# Patient Record
Sex: Female | Born: 1980 | Race: White | Hispanic: Yes | Marital: Single | Smoking: Never smoker
Health system: Southern US, Community
[De-identification: ages and names within clinical notes are randomized; demographics above are authoritative.]

## PROBLEM LIST (undated history)

## (undated) DIAGNOSIS — F419 Anxiety disorder, unspecified: Secondary | ICD-10-CM

## (undated) DIAGNOSIS — Z789 Other specified health status: Secondary | ICD-10-CM

## (undated) HISTORY — DX: Anxiety disorder, unspecified: F41.9

## (undated) HISTORY — PX: NO PAST SURGERIES: SHX2092

---

## 2006-01-23 ENCOUNTER — Ambulatory Visit: Payer: Self-pay | Admitting: Nurse Practitioner

## 2006-01-25 ENCOUNTER — Ambulatory Visit (HOSPITAL_COMMUNITY): Admission: RE | Admit: 2006-01-25 | Discharge: 2006-01-25 | Payer: Self-pay | Admitting: Family Medicine

## 2006-01-30 ENCOUNTER — Ambulatory Visit: Payer: Self-pay | Admitting: *Deleted

## 2006-06-01 ENCOUNTER — Ambulatory Visit: Payer: Self-pay | Admitting: Nurse Practitioner

## 2007-01-30 ENCOUNTER — Encounter (INDEPENDENT_AMBULATORY_CARE_PROVIDER_SITE_OTHER): Payer: Self-pay | Admitting: *Deleted

## 2007-05-17 ENCOUNTER — Ambulatory Visit: Payer: Self-pay | Admitting: Family Medicine

## 2007-05-21 ENCOUNTER — Ambulatory Visit: Payer: Self-pay | Admitting: Internal Medicine

## 2007-05-21 ENCOUNTER — Encounter (INDEPENDENT_AMBULATORY_CARE_PROVIDER_SITE_OTHER): Payer: Self-pay | Admitting: Nurse Practitioner

## 2007-05-22 ENCOUNTER — Ambulatory Visit: Payer: Self-pay | Admitting: Internal Medicine

## 2007-08-13 ENCOUNTER — Ambulatory Visit: Payer: Self-pay | Admitting: Internal Medicine

## 2007-09-22 IMAGING — CT CT ABDOMEN W/ CM
1 of 3 series · 14 of 32 positions shown, 19 images · IV contrast (30ML OMNI-MIX & [ID] OMNIP 300%)
Comparison: none

CLINICAL DATA: Epigastric pain through the back.    Weight loss and constipation.  Rule out gallstones or other abnormality.  
ABDOMEN CT WITH CONTRAST:
TECHNIQUE: Multidetector CT imaging of the abdomen was performed following the standard protocol during bolus administration of intravenous contrast.
Contrast:  100 cc Omnipaque 300.
TECHNIQUE: Multidetector CT imaging of the pelvis was performed following the standard protocol during bolus administration of intravenous contrast.

[Series 2: abd pelvis · axial · 0.65mm/px · z∈[-418,-63]mm · 14 of 81 slices shown, 19 images]
[im 5/81  soft-tissue]
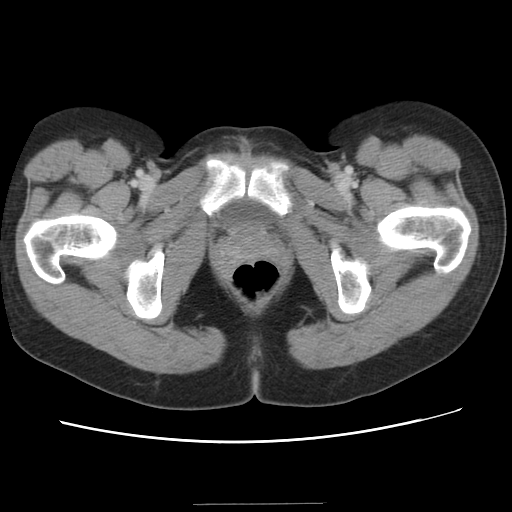
[im 5/81  bone]
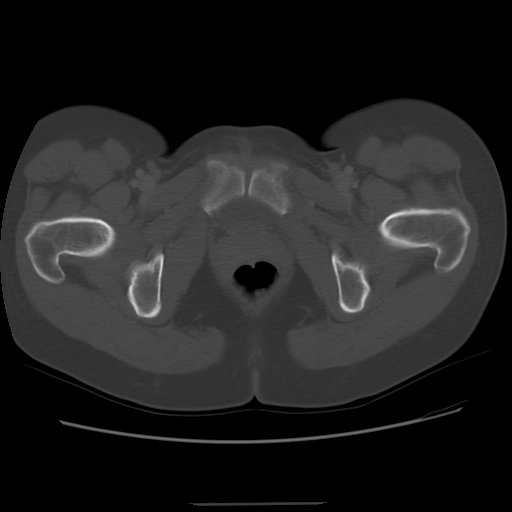
[im 10/81  soft-tissue]
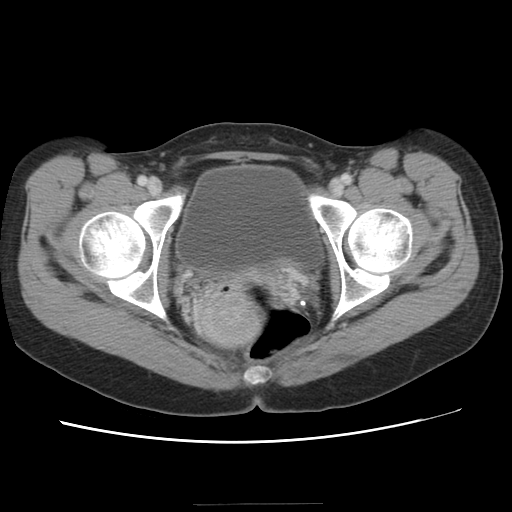
[im 19/81  soft-tissue]
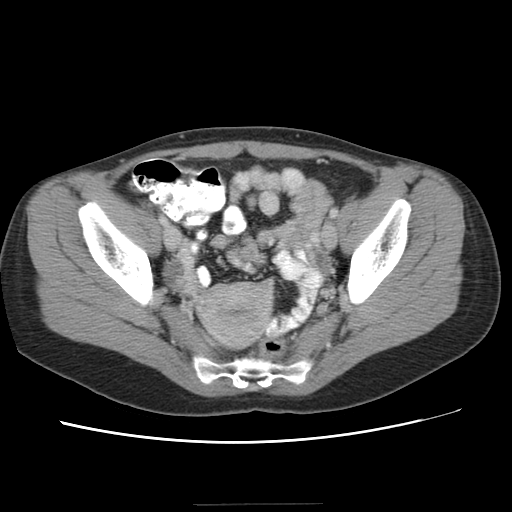
[im 24/81  soft-tissue]
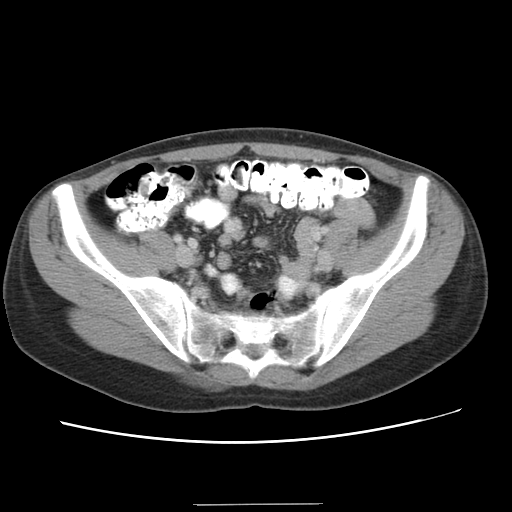
[im 29/81  soft-tissue]
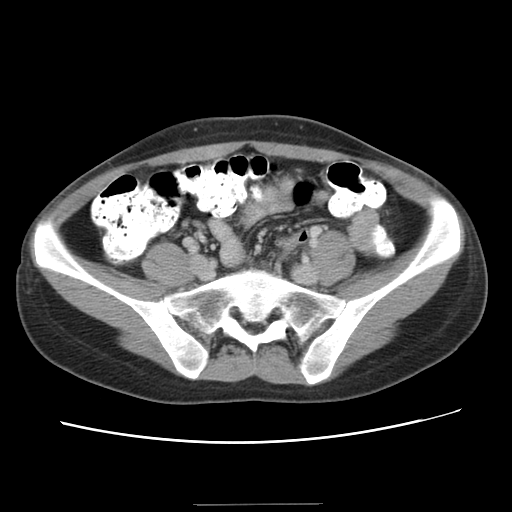
[im 33/81  soft-tissue]
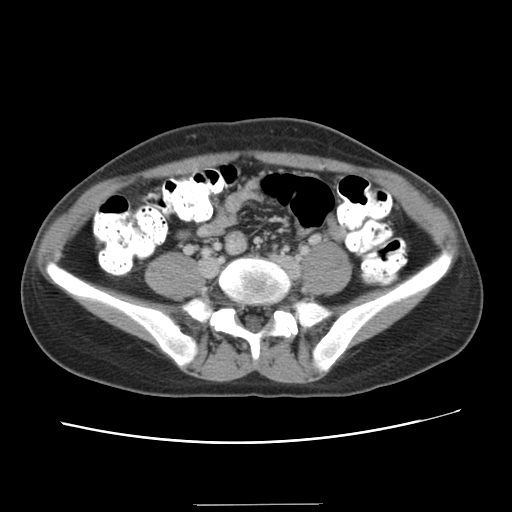
[im 43/81  soft-tissue]
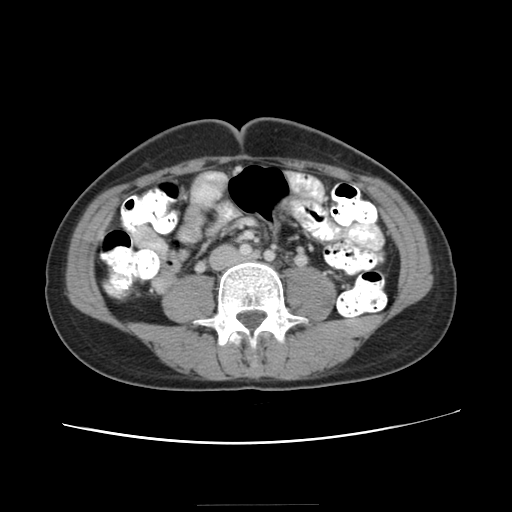
[im 48/81  soft-tissue]
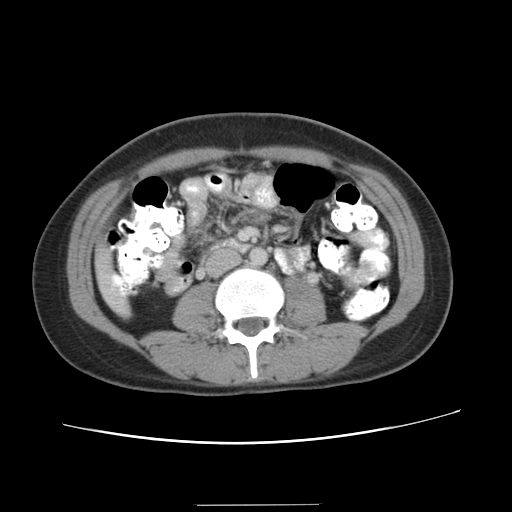
[im 52/81  soft-tissue]
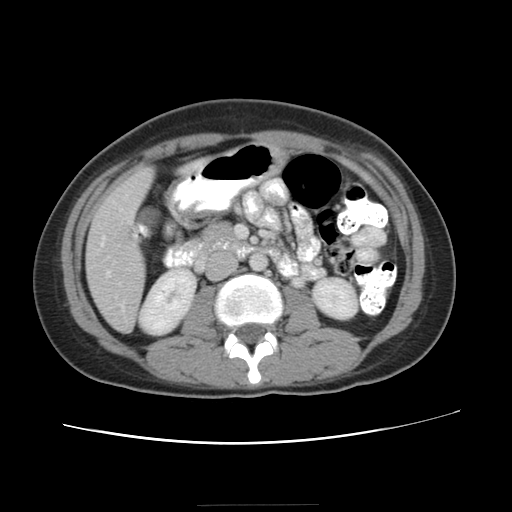
[im 52/81  bone]
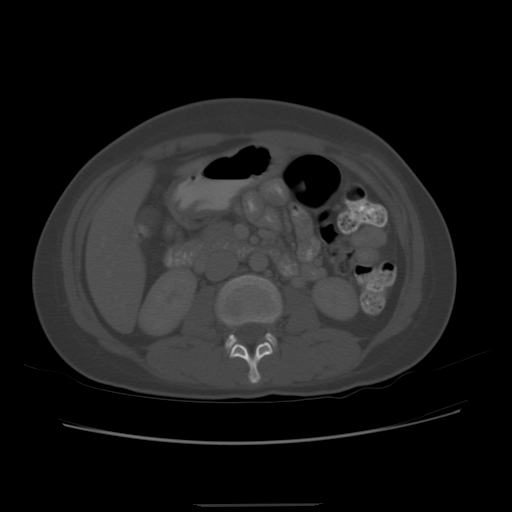
[im 57/81  soft-tissue]
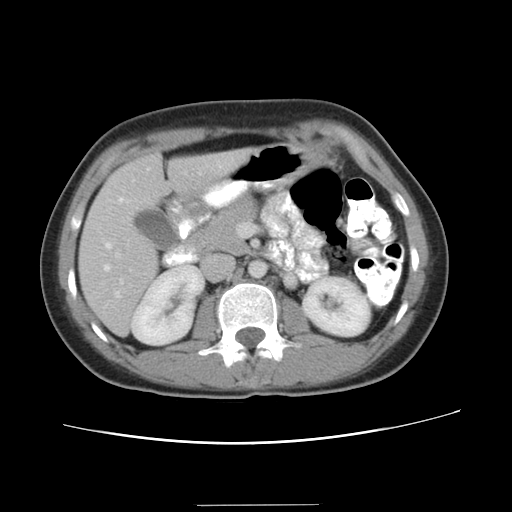
[im 62/81  soft-tissue]
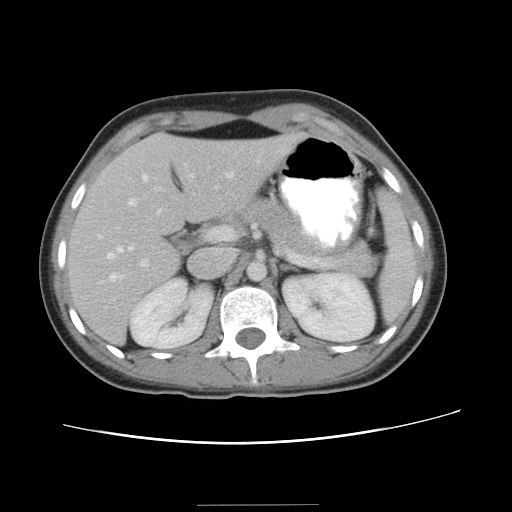
[im 62/81  lung]
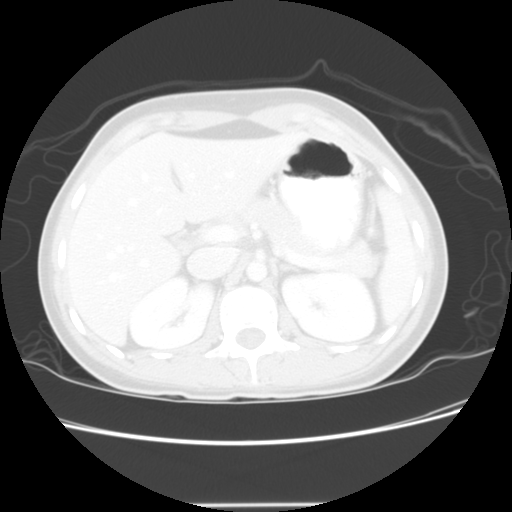
[im 66/81  lung]
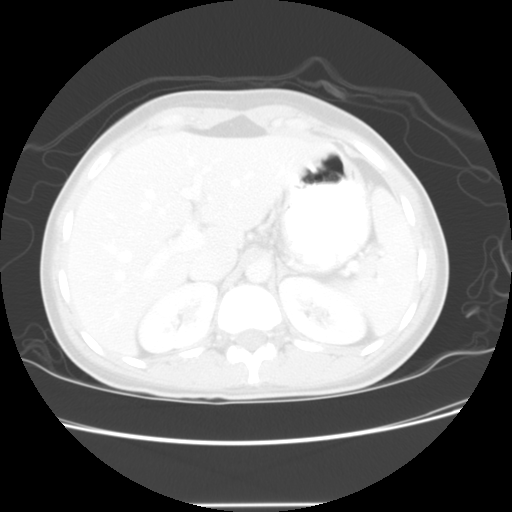
[im 71/81  soft-tissue]
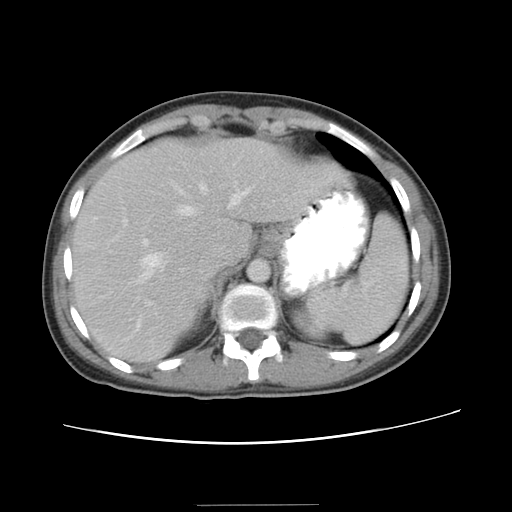
[im 71/81  lung]
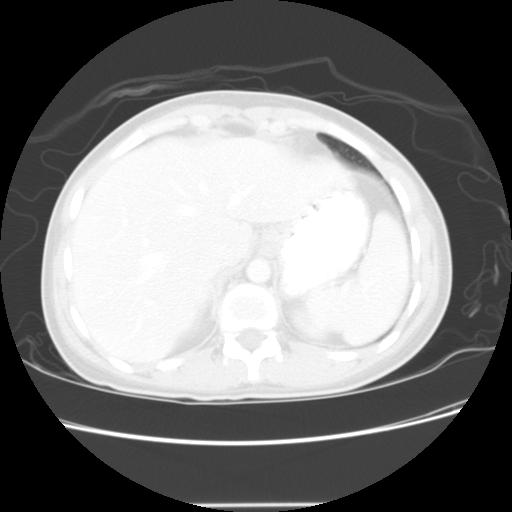
[im 76/81  soft-tissue]
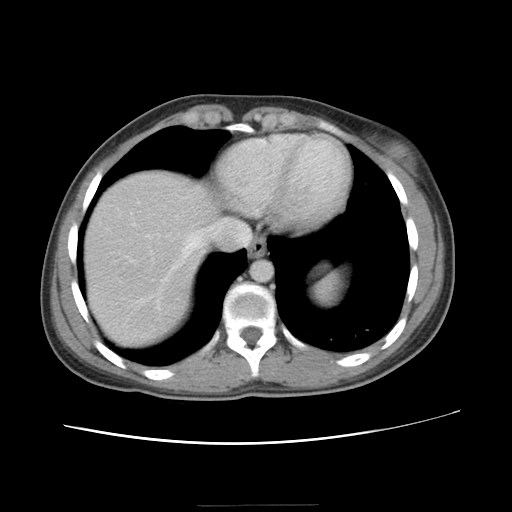
[im 76/81  lung]
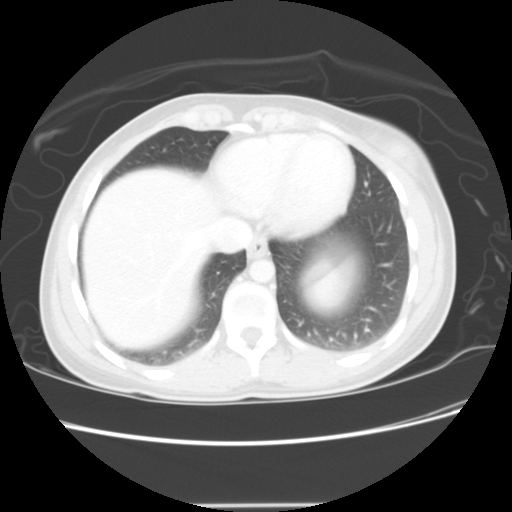

[14 of 32 positions shown; findings below may reference images not displayed]

FINDINGS: The lung bases are clear.   
Following the administration of intravenous contrast, the liver, gallbladder, spleen, kidneys, adrenals and pancreas all have a normal appearance.  Delayed images through the kidneys reveal symmetric bilateral excretion and a normal appearance of the proximal ureters.  Small bowel contrast was achieved and no focal bowel abnormalities are seen.  No intraabdominal adenopathy or fluid is noted.
IMPRESSION: Normal abdominal CT. 
PELVIS CT WITH CONTRAST:
FINDINGS: Distal colonic and small bowel contrast was achieved.  No focal bowel abnormalities are noted.  The appendix is filled.  No intraabdominal inflammatory change is seen and no abnormal fluid collections are noted.  The uterus and ovaries both have a normal appearance.  A small amount of fluid is seen in the cul-de-sac and given the patient?s age is likely physiologic in nature.  No abnormal adenopathy is noted.  Bony structures are intact.   Delayed images through the bladder reveal a normal bladder contour and normal distal ureters.
IMPRESSION: Normal pelvic CT.

## 2007-11-04 ENCOUNTER — Ambulatory Visit: Payer: Self-pay | Admitting: Internal Medicine

## 2008-01-27 ENCOUNTER — Ambulatory Visit: Payer: Self-pay | Admitting: Internal Medicine

## 2008-04-17 ENCOUNTER — Ambulatory Visit: Payer: Self-pay | Admitting: Internal Medicine

## 2008-07-09 ENCOUNTER — Ambulatory Visit: Payer: Self-pay | Admitting: Internal Medicine

## 2008-09-30 ENCOUNTER — Ambulatory Visit: Payer: Self-pay | Admitting: Internal Medicine

## 2008-10-27 ENCOUNTER — Ambulatory Visit: Payer: Self-pay | Admitting: Internal Medicine

## 2008-12-22 ENCOUNTER — Ambulatory Visit: Payer: Self-pay | Admitting: Internal Medicine

## 2009-03-15 ENCOUNTER — Ambulatory Visit: Payer: Self-pay | Admitting: Internal Medicine

## 2009-06-08 ENCOUNTER — Ambulatory Visit: Payer: Self-pay | Admitting: Internal Medicine

## 2010-06-05 ENCOUNTER — Encounter: Payer: Self-pay | Admitting: Family Medicine

## 2011-01-06 LAB — GC/CHLAMYDIA PROBE AMP, GENITAL
Chlamydia: NEGATIVE
Gonorrhea: NEGATIVE

## 2011-02-02 LAB — ANTIBODY SCREEN: Antibody Screen: NEGATIVE

## 2011-03-27 LAB — RPR: RPR: NONREACTIVE

## 2011-05-16 NOTE — L&D Delivery Note (Signed)
Delivery Note At 9:47 PM a viable female was delivered via Vaginal, Spontaneous Delivery (Presentation: ; Occiput Anterior).  APGAR: 8, 9; weight 7 lb 3.9 oz (3285 g).   Placenta status: Intact, Spontaneous.  Cord: 3 vessels with the following complications: None.    Anesthesia: Epidural  Episiotomy: None Lacerations: 1st degree;Perineal Suture Repair: 3.0 vicryl rapide Est. Blood Loss (mL): 200 ml  Mom to postpartum.  Baby to nursery-stable.  JACKSON-MOORE,Josehua Hammar A 06/15/2011, 10:08 PM

## 2011-06-15 ENCOUNTER — Inpatient Hospital Stay (HOSPITAL_COMMUNITY): Payer: Medicaid Other | Admitting: Anesthesiology

## 2011-06-15 ENCOUNTER — Encounter (HOSPITAL_COMMUNITY): Payer: Self-pay | Admitting: Anesthesiology

## 2011-06-15 ENCOUNTER — Encounter (HOSPITAL_COMMUNITY): Payer: Self-pay | Admitting: *Deleted

## 2011-06-15 ENCOUNTER — Inpatient Hospital Stay (HOSPITAL_COMMUNITY)
Admission: AD | Admit: 2011-06-15 | Discharge: 2011-06-17 | DRG: 775 | Disposition: A | Discharge status: Home / Self Care | Payer: Medicaid Other | Source: Ambulatory Visit | Attending: Obstetrics & Gynecology | Admitting: Obstetrics & Gynecology

## 2011-06-15 DIAGNOSIS — IMO0001 Reserved for inherently not codable concepts without codable children: Secondary | ICD-10-CM

## 2011-06-15 HISTORY — DX: Other specified health status: Z78.9

## 2011-06-15 LAB — CBC
HCT: 36.7 % (ref 36.0–46.0)
Hemoglobin: 12.3 g/dL (ref 12.0–15.0)
MCH: 30.9 pg (ref 26.0–34.0)
MCV: 92.2 fL (ref 78.0–100.0)
RBC: 3.98 MIL/uL (ref 3.87–5.11)

## 2011-06-15 LAB — ABO/RH: RH Type: POSITIVE

## 2011-06-15 LAB — RPR: RPR: NONREACTIVE

## 2011-06-15 MED ORDER — LIDOCAINE HCL 1.5 % IJ SOLN
INTRAMUSCULAR | Status: DC | PRN
  Administered 2011-06-15 (×2): 5 mL via EPIDURAL

## 2011-06-15 MED ORDER — FLEET ENEMA 7-19 GM/118ML RE ENEM: 1.0000 | ENEMA | RECTAL | Status: DC | PRN

## 2011-06-15 MED ORDER — IBUPROFEN 600 MG PO TABS: 600.0000 mg | ORAL_TABLET | Freq: Four times a day (QID) | ORAL | Status: DC | PRN

## 2011-06-15 MED ORDER — LACTATED RINGERS IV SOLN: 500.0000 mL | Freq: Once | INTRAVENOUS | Status: DC

## 2011-06-15 MED ORDER — EPHEDRINE 5 MG/ML INJ: 10.0000 mg | INTRAVENOUS | Status: DC | PRN

## 2011-06-15 MED ORDER — PHENYLEPHRINE 40 MCG/ML (10ML) SYRINGE FOR IV PUSH (FOR BLOOD PRESSURE SUPPORT): 80.0000 ug | PREFILLED_SYRINGE | INTRAVENOUS | Status: DC | PRN

## 2011-06-15 MED ORDER — OXYTOCIN 20 UNITS IN LACTATED RINGERS INFUSION - SIMPLE
125.0000 mL/h | Freq: Once | INTRAVENOUS | Status: AC
  Administered 2011-06-15: 125 mL/h via INTRAVENOUS

## 2011-06-15 MED ORDER — ACETAMINOPHEN 325 MG PO TABS: 650.0000 mg | ORAL_TABLET | ORAL | Status: DC | PRN

## 2011-06-15 MED ORDER — OXYCODONE-ACETAMINOPHEN 5-325 MG PO TABS
1.0000 | ORAL_TABLET | ORAL | Status: DC | PRN
  Administered 2011-06-15: 1 via ORAL
  Filled 2011-06-15: qty 1

## 2011-06-15 MED ORDER — OXYTOCIN BOLUS FROM INFUSION
500.0000 mL | Freq: Once | INTRAVENOUS | Status: DC
  Filled 2011-06-15: qty 1000
  Filled 2011-06-15: qty 500

## 2011-06-15 MED ORDER — ONDANSETRON HCL 4 MG/2ML IJ SOLN: 4.0000 mg | Freq: Four times a day (QID) | INTRAMUSCULAR | Status: DC | PRN

## 2011-06-15 MED ORDER — EPHEDRINE 5 MG/ML INJ
10.0000 mg | INTRAVENOUS | Status: DC | PRN
  Filled 2011-06-15: qty 4

## 2011-06-15 MED ORDER — LACTATED RINGERS IV SOLN: 500.0000 mL | INTRAVENOUS | Status: DC | PRN

## 2011-06-15 MED ORDER — LACTATED RINGERS IV SOLN
INTRAVENOUS | Status: DC
  Administered 2011-06-15 (×2): via INTRAVENOUS

## 2011-06-15 MED ORDER — BUTORPHANOL TARTRATE 2 MG/ML IJ SOLN
1.0000 mg | INTRAMUSCULAR | Status: DC | PRN
  Administered 2011-06-15: 1 mg via INTRAVENOUS
  Filled 2011-06-15: qty 1

## 2011-06-15 MED ORDER — FENTANYL 2.5 MCG/ML BUPIVACAINE 1/10 % EPIDURAL INFUSION (WH - ANES)
14.0000 mL/h | INTRAMUSCULAR | Status: DC
  Administered 2011-06-15: 12 mL/h via EPIDURAL
  Filled 2011-06-15: qty 60

## 2011-06-15 MED ORDER — DIPHENHYDRAMINE HCL 50 MG/ML IJ SOLN: 12.5000 mg | INTRAMUSCULAR | Status: DC | PRN

## 2011-06-15 MED ORDER — CITRIC ACID-SODIUM CITRATE 334-500 MG/5ML PO SOLN: 30.0000 mL | ORAL | Status: DC | PRN

## 2011-06-15 MED ORDER — LIDOCAINE HCL (PF) 1 % IJ SOLN
30.0000 mL | INTRAMUSCULAR | Status: DC | PRN
  Administered 2011-06-15: 30 mL via SUBCUTANEOUS
  Filled 2011-06-15: qty 30

## 2011-06-15 NOTE — Progress Notes (Signed)
[facility] Department of Clinical Social Work Documentation of Interpretation   I assisted __Lori  RN_________________ with interpretation of ___questions___________________ for this patient. 

## 2011-06-15 NOTE — Progress Notes (Signed)
Zeva Aguirre-Cortez is a 31 y.o. G3P2002 at [redacted]w[redacted]d by LMP admitted for active labor  Subjective: C/O UCs  Objective: BP 129/92  Pulse 93  Temp(Src) 98.3 F (36.8 C) (Oral)  Resp 18  Ht 5\' 4"  (1.626 m)  Wt 66.679 kg (147 lb)  BMI 25.23 kg/m2  SpO2 98%  LMP 09/18/2010      FHT:  FHR: 140 bpm, variability: moderate,  accelerations:  Present,  decelerations:  Absent UC:   regular, every 2 minutes SVE:   Dilation: Lip/rim Effacement (%): 100 Station: 0 Exam by:: a. harper, rnc ob  Labs: Lab Results  Component Value Date   WBC 11.4* 06/15/2011   HGB 12.3 06/15/2011   HCT 36.7 06/15/2011   MCV 92.2 06/15/2011   PLT 269 06/15/2011    Assessment / Plan: Spontaneous labor, progressing normally  Labor: Progressing normally Preeclampsia:  n/a Fetal Wellbeing:  Category I Pain Control:  Epidural I/D:  n/a Anticipated MOD:  NSVD  JACKSON-MOORE,Raeana Blinn A 06/15/2011, 8:52 PM

## 2011-06-15 NOTE — Anesthesia Procedure Notes (Signed)
Epidural Patient location during procedure: OB Start time: 06/15/2011 5:56 PM  Staffing Anesthesiologist: Brayton Caves R Performed by: anesthesiologist   Preanesthetic Checklist Completed: patient identified, site marked, surgical consent, pre-op evaluation, timeout performed, IV checked, risks and benefits discussed and monitors and equipment checked  Epidural Patient position: sitting Prep: site prepped and draped and DuraPrep Patient monitoring: continuous pulse ox and blood pressure Approach: midline Injection technique: LOR air and LOR saline  Needle:  Needle type: Tuohy  Needle gauge: 17 G Needle length: 9 cm Needle insertion depth: 5 cm cm Catheter type: closed end flexible Catheter size: 19 Gauge Catheter at skin depth: 10 cm Test dose: negative  Assessment Events: blood not aspirated, injection not painful, no injection resistance, negative IV test and no paresthesia  Additional Notes Patient identified.  Risk benefits discussed including failed block, incomplete pain control, headache, nerve damage, paralysis, blood pressure changes, nausea, vomiting, reactions to medication both toxic or allergic, and postpartum back pain.  Patient expressed understanding and wished to proceed.  All questions were answered.  Sterile technique used throughout procedure and epidural site dressed with sterile barrier dressing. No paresthesia or other complications noted.The patient did not experience any signs of intravascular injection such as tinnitus or metallic taste in mouth nor signs of intrathecal spread such as rapid motor block. Please see nursing notes for vital signs.

## 2011-06-15 NOTE — Progress Notes (Signed)
U/C's started today at 0800 and coming closer together.  Now U/c's are every 4 min. Per pt.  No vaginal bleeding.

## 2011-06-15 NOTE — Anesthesia Preprocedure Evaluation (Signed)

## 2011-06-15 NOTE — H&P (Signed)
Donna Trevino is a 31 y.o. female presenting for contractions. Maternal Medical History:  Reason for admission: Reason for admission: rupture of membranes and contractions.  Contractions: Frequency: regular.    Prenatal complications: no prenatal complications   OB History    Grav Para Term Preterm Abortions TAB SAB Ect Mult Living   3 2 2       2      Past Medical History  Diagnosis Date  . No pertinent past medical history    Past Surgical History  Procedure Date  . No past surgeries    Family History: family history is negative for Anesthesia problems. Social History:  reports that she has never smoked. She has never used smokeless tobacco. She reports that she does not drink alcohol or use illicit drugs.  Review of Systems  Constitutional: Negative for fever.  Eyes: Negative for blurred vision.  Respiratory: Negative for shortness of breath.   Gastrointestinal: Negative for vomiting.  Skin: Negative for rash.  Neurological: Negative for headaches.    Dilation: 4.5 Effacement (%): 80 Station: -2 Exam by:: D. Coley, RN Blood pressure 124/78, pulse 78, temperature 98.1 F (36.7 C), temperature source Oral, resp. rate 16, height 5\' 4"  (1.626 m), weight 66.679 kg (147 lb), last menstrual period 09/18/2010. Maternal Exam:  Uterine Assessment: Contraction frequency is regular.   Abdomen: Fetal presentation: vertex  Introitus: Ferning test: positive.  Amniotic fluid character: clear.  Cervix: Cervix evaluated by digital exam.     Fetal Exam Fetal Monitor Review: Variability: moderate (6-25 bpm).   Pattern: accelerations present and no decelerations.    Fetal State Assessment: Category I - tracings are normal.     Physical Exam  Constitutional: She appears well-developed.  HENT:  Head: Normocephalic.  Neck: Neck supple. No thyromegaly present.  Cardiovascular: Normal rate and regular rhythm.   Respiratory: Breath sounds normal.  GI: Soft. Bowel  sounds are normal.  Skin: No rash noted.    Prenatal labs: ABO, Rh:   Antibody:   Rubella:   RPR:    HBsAg:    HIV:    GBS: Negative (01/10 0000)   Assessment/Plan: Multipara at term, active labor, Category 1 FHT Admit, anticipate an NSVD   JACKSON-MOORE,Sahiti Joswick A 06/15/2011, 4:45 PM

## 2011-06-16 LAB — CBC
Hemoglobin: 10.5 g/dL — ABNORMAL LOW (ref 12.0–15.0)
MCH: 30.2 pg (ref 26.0–34.0)
MCHC: 32.7 g/dL (ref 30.0–36.0)
MCV: 92.2 fL (ref 78.0–100.0)
RBC: 3.48 MIL/uL — ABNORMAL LOW (ref 3.87–5.11)

## 2011-06-16 MED ORDER — PRENATAL MULTIVITAMIN CH
1.0000 | ORAL_TABLET | Freq: Every day | ORAL | Status: DC
  Administered 2011-06-16 – 2011-06-17 (×2): 1 via ORAL
  Filled 2011-06-16 (×2): qty 1

## 2011-06-16 MED ORDER — ZOLPIDEM TARTRATE 5 MG PO TABS: 5.0000 mg | ORAL_TABLET | Freq: Every evening | ORAL | Status: DC | PRN

## 2011-06-16 MED ORDER — LANOLIN HYDROUS EX OINT: TOPICAL_OINTMENT | CUTANEOUS | Status: DC | PRN

## 2011-06-16 MED ORDER — FERROUS SULFATE 325 (65 FE) MG PO TABS
325.0000 mg | ORAL_TABLET | Freq: Two times a day (BID) | ORAL | Status: DC
  Administered 2011-06-16 – 2011-06-17 (×3): 325 mg via ORAL
  Filled 2011-06-16 (×3): qty 1

## 2011-06-16 MED ORDER — IBUPROFEN 600 MG PO TABS
600.0000 mg | ORAL_TABLET | Freq: Four times a day (QID) | ORAL | Status: DC
  Administered 2011-06-16 – 2011-06-17 (×7): 600 mg via ORAL
  Filled 2011-06-16 (×7): qty 1

## 2011-06-16 MED ORDER — ONDANSETRON HCL 4 MG PO TABS: 4.0000 mg | ORAL_TABLET | ORAL | Status: DC | PRN

## 2011-06-16 MED ORDER — SENNOSIDES-DOCUSATE SODIUM 8.6-50 MG PO TABS
2.0000 | ORAL_TABLET | Freq: Every day | ORAL | Status: DC
  Administered 2011-06-16: 2 via ORAL

## 2011-06-16 MED ORDER — DIPHENHYDRAMINE HCL 25 MG PO CAPS: 25.0000 mg | ORAL_CAPSULE | Freq: Four times a day (QID) | ORAL | Status: DC | PRN

## 2011-06-16 MED ORDER — MAGNESIUM HYDROXIDE 400 MG/5ML PO SUSP: 30.0000 mL | ORAL | Status: DC | PRN

## 2011-06-16 MED ORDER — MEDROXYPROGESTERONE ACETATE 150 MG/ML IM SUSP: 150.0000 mg | INTRAMUSCULAR | Status: DC | PRN

## 2011-06-16 MED ORDER — MEASLES, MUMPS & RUBELLA VAC ~~LOC~~ INJ
0.5000 mL | INJECTION | Freq: Once | SUBCUTANEOUS | Status: DC
  Filled 2011-06-16: qty 0.5

## 2011-06-16 MED ORDER — INFLUENZA VIRUS VACC SPLIT PF IM SUSP
0.5000 mL | INTRAMUSCULAR | Status: AC | PRN
  Administered 2011-06-16: 0.5 mL via INTRAMUSCULAR
  Filled 2011-06-16: qty 0.5

## 2011-06-16 MED ORDER — WITCH HAZEL-GLYCERIN EX PADS: 1.0000 "application " | MEDICATED_PAD | CUTANEOUS | Status: DC | PRN

## 2011-06-16 MED ORDER — TETANUS-DIPHTH-ACELL PERTUSSIS 5-2.5-18.5 LF-MCG/0.5 IM SUSP: 0.5000 mL | Freq: Once | INTRAMUSCULAR | Status: DC

## 2011-06-16 MED ORDER — ONDANSETRON HCL 4 MG/2ML IJ SOLN: 4.0000 mg | INTRAMUSCULAR | Status: DC | PRN

## 2011-06-16 MED ORDER — OXYCODONE-ACETAMINOPHEN 5-325 MG PO TABS
1.0000 | ORAL_TABLET | ORAL | Status: DC | PRN
  Administered 2011-06-16: 1 via ORAL
  Filled 2011-06-16: qty 1

## 2011-06-16 MED ORDER — DIBUCAINE 1 % RE OINT: 1.0000 "application " | TOPICAL_OINTMENT | RECTAL | Status: DC | PRN

## 2011-06-16 MED ORDER — BENZOCAINE-MENTHOL 20-0.5 % EX AERO: 1.0000 "application " | INHALATION_SPRAY | CUTANEOUS | Status: DC | PRN

## 2011-06-16 NOTE — Anesthesia Postprocedure Evaluation (Signed)
  Anesthesia Post-op Note  Patient: International aid/development worker  Procedure(s) Performed: * No procedures listed *  Patient Location: PACU and Mother/Baby  Anesthesia Type: Epidural  Level of Consciousness: awake  Airway and Oxygen Therapy: Patient Spontanous Breathing  Post-op Pain: none  Post-op Assessment: Patient's Cardiovascular Status Stable, Respiratory Function Stable and Pain level controlled  Post-op Vital Signs: Reviewed and stable  Complications: No apparent anesthesia complications

## 2011-06-16 NOTE — Progress Notes (Signed)
Post Partum Day 1 Subjective: no complaints, up ad lib, voiding, tolerating PO and breastfeeding without difficulty. Denies HA, RUQ pain or blurred vision.   Objective: Blood pressure 95/56, pulse 78, temperature 98 F (36.7 C), temperature source Oral, resp. rate 18, height 5\' 4"  (1.626 m), weight 66.679 kg (147 lb), last menstrual period 09/18/2010, SpO2 98.00%, unknown if currently breastfeeding.  Physical Exam:  General: alert, cooperative, appears stated age and no distress Lochia: appropriate Uterine Fundus: firm Incision: healing well, no significant drainage, no dehiscence, no significant erythema DVT Evaluation: No evidence of DVT seen on physical exam. No cords or calf tenderness. No significant calf/ankle edema.   Basename 06/16/11 0540 06/15/11 1700  HGB 10.5* 12.3  HCT 32.1* 36.7    Assessment/Plan: Plan for discharge tomorrow and Breastfeeding   LOS: 1 day   HAMBY, Maleiyah Releford 06/16/2011, 8:06 AM

## 2011-06-16 NOTE — Progress Notes (Signed)
UR Chart review completed.  

## 2011-06-17 NOTE — Progress Notes (Signed)
Patient ID: Donna Trevino, female   DOB: April 23, 1981, 31 y.o.   MRN: [id number] Postpartum day one Vital signs normal Fundus firm Lochia moderate Legs negative No complaints

## 2011-06-18 NOTE — Discharge Summary (Signed)
Obstetric Discharge Summary Reason for Admission: onset of labor Prenatal Procedures: none Intrapartum Procedures: spontaneous vaginal delivery Postpartum Procedures: none Complications-Operative and Postpartum: none Hemoglobin  Date Value Range Status  06/16/2011 10.5* 12.0-15.0 (g/dL) Final     HCT  Date Value Range Status  06/16/2011 32.1* 36.0-46.0 (%) Final    Discharge Diagnoses: Term Pregnancy-delivered  Discharge Information: Date: 06/18/2011 Activity: pelvic rest Diet: routine Medications: Percocet Condition: stable Instructions: refer to practice specific booklet Discharge to: home Follow-up Information    Follow up with HARPER,CHARLES A, MD. Call in 6 weeks.   Contact information:   [address] [place] [address] [phone]          Newborn Data: Live born female  Birth Weight: 7 lb 3.9 oz (3285 g) APGAR: 8, 9  Home with mother.  Wealthy Danielski A 06/18/2011, 6:54 AM

## 2014-03-16 ENCOUNTER — Encounter (HOSPITAL_COMMUNITY): Payer: Self-pay | Admitting: *Deleted

## 2020-10-01 ENCOUNTER — Telehealth: Payer: Self-pay | Admitting: *Deleted

## 2020-10-01 NOTE — Telephone Encounter (Signed)
Notes on file from [facility], [place], [place], [phone]. Referral sent to scheduling.

## 2020-10-17 ENCOUNTER — Emergency Department (HOSPITAL_COMMUNITY)
Admission: EM | Admit: 2020-10-17 | Discharge: 2020-10-17 | Disposition: A | Discharge status: Home / Self Care | Payer: Self-pay | Attending: Emergency Medicine | Admitting: Emergency Medicine

## 2020-10-17 ENCOUNTER — Other Ambulatory Visit: Payer: Self-pay

## 2020-10-17 ENCOUNTER — Encounter (HOSPITAL_COMMUNITY): Payer: Self-pay

## 2020-10-17 DIAGNOSIS — K529 Noninfective gastroenteritis and colitis, unspecified: Secondary | ICD-10-CM

## 2020-10-17 DIAGNOSIS — R197 Diarrhea, unspecified: Secondary | ICD-10-CM | POA: Insufficient documentation

## 2020-10-17 DIAGNOSIS — R1013 Epigastric pain: Secondary | ICD-10-CM | POA: Insufficient documentation

## 2020-10-17 DIAGNOSIS — R112 Nausea with vomiting, unspecified: Secondary | ICD-10-CM | POA: Insufficient documentation

## 2020-10-17 LAB — COMPREHENSIVE METABOLIC PANEL
ALT: 22 U/L (ref 0–44)
AST: 27 U/L (ref 15–41)
Albumin: 4.4 g/dL (ref 3.5–5.0)
Alkaline Phosphatase: 58 U/L (ref 38–126)
Anion gap: 9 (ref 5–15)
BUN: 9 mg/dL (ref 6–20)
CO2: 25 mmol/L (ref 22–32)
Calcium: 9.2 mg/dL (ref 8.9–10.3)
Chloride: 107 mmol/L (ref 98–111)
Creatinine, Ser: 0.68 mg/dL (ref 0.44–1.00)
GFR, Estimated: >60 mL/min (ref >60)
Glucose, Bld: 113 mg/dL — ABNORMAL HIGH (ref 70–99)
Potassium: 3.8 mmol/L (ref 3.5–5.1)
Sodium: 141 mmol/L (ref 135–145)
Total Bilirubin: 0.8 mg/dL (ref 0.3–1.2)
Total Protein: 8 g/dL (ref 6.5–8.1)

## 2020-10-17 LAB — CBC
HCT: 44.7 % (ref 36.0–46.0)
Hemoglobin: 14.6 g/dL (ref 12.0–15.0)
MCH: 31.7 pg (ref 26.0–34.0)
MCHC: 32.7 g/dL (ref 30.0–36.0)
MCV: 97 fL (ref 80.0–100.0)
Platelets: 292 10*3/uL (ref 150–400)
RBC: 4.61 MIL/uL (ref 3.87–5.11)
RDW: 11.8 % (ref 11.5–15.5)
WBC: 10 10*3/uL (ref 4.0–10.5)
nRBC: 0 % (ref 0.0–0.2)

## 2020-10-17 LAB — URINALYSIS, ROUTINE W REFLEX MICROSCOPIC
Bacteria, UA: NONE SEEN
Bilirubin Urine: NEGATIVE
Glucose, UA: NEGATIVE mg/dL
Ketones, ur: NEGATIVE mg/dL
Leukocytes,Ua: NEGATIVE
Nitrite: NEGATIVE
Protein, ur: 30 mg/dL — AB
Specific Gravity, Urine: 1.024 (ref 1.005–1.030)
pH: 5 (ref 5.0–8.0)

## 2020-10-17 LAB — I-STAT BETA HCG BLOOD, ED (MC, WL, AP ONLY): I-stat hCG, quantitative: <5 m[IU]/mL (ref <5)

## 2020-10-17 LAB — LIPASE, BLOOD: Lipase: 39 U/L (ref 11–51)

## 2020-10-17 MED ORDER — DICYCLOMINE HCL 10 MG PO CAPS
10.0000 mg | ORAL_CAPSULE | Freq: Once | ORAL | Status: AC
  Administered 2020-10-17: 10 mg via ORAL
  Filled 2020-10-17: qty 1

## 2020-10-17 MED ORDER — ONDANSETRON 4 MG PO TBDP: 4.0000 mg | ORAL_TABLET | Freq: Three times a day (TID) | ORAL | 0 refills | Status: DC | PRN

## 2020-10-17 MED ORDER — ONDANSETRON 4 MG PO TBDP
4.0000 mg | ORAL_TABLET | Freq: Once | ORAL | Status: AC
  Administered 2020-10-17: 4 mg via ORAL
  Filled 2020-10-17: qty 1

## 2020-10-17 MED ORDER — ALUM & MAG HYDROXIDE-SIMETH 200-200-20 MG/5ML PO SUSP
30.0000 mL | Freq: Once | ORAL | Status: AC
  Administered 2020-10-17: 30 mL via ORAL
  Filled 2020-10-17: qty 30

## 2020-10-17 NOTE — ED Provider Notes (Signed)
[facility] EMERGENCY DEPARTMENT Provider Note   CSN: [id number] Arrival date & time: 10/17/20  1554     History No chief complaint on file.   Donna Trevino is a 40 y.o. female.  The history is provided by the patient. A language interpreter was used.   Donna Trevino is a 40 y.o. female who presents to the Emergency Department complaining of N/V/D abdominal pain. Pain started 2 AM. She did have upset stomach about two days prior. Her pain is located in the epigastric region. Shortly after her pain started she developed significant nausea, vomiting, diarrhea. No hematemesis, hematochezia or melena. She has no shortness of breath or fevers or dysuria. She believes this may be related to eating pork the evening prior to her symptoms developing.   No tobacco.  No alcohol.  No street drugs.  No prior surgeries.      Past Medical History:  Diagnosis Date  . No pertinent past medical history     Patient Active Problem List   Diagnosis Date Noted  . Normal delivery 06/15/2011    Past Surgical History:  Procedure Laterality Date  . NO PAST SURGERIES       OB History    Gravida  3   Para  3   Term  3   Preterm      AB      Living  3     SAB      IAB      Ectopic      Multiple      Live Births  1           Family History  Problem Relation Age of Onset  . Anesthesia problems Neg Hx     Social History   Tobacco Use  . Smoking status: Never Smoker  . Smokeless tobacco: Never Used  Substance Use Topics  . Alcohol use: No  . Drug use: No    Home Medications Prior to Admission medications   Not on File    Allergies    Patient has no known allergies.  Review of Systems   Review of Systems  All other systems reviewed and are negative.   Physical Exam Updated Vital Signs BP 138/78 (BP Location: Left Arm)   Pulse 78   Temp 98.1 F (36.7 C) (Oral)   Resp 16   SpO2 (!) 79%   Physical Exam Vitals and  nursing note reviewed.  Constitutional:      Appearance: She is well-developed.  HENT:     Head: Normocephalic and atraumatic.  Cardiovascular:     Rate and Rhythm: Normal rate and regular rhythm.     Heart sounds: No murmur heard.   Pulmonary:     Effort: Pulmonary effort is normal. No respiratory distress.     Breath sounds: Normal breath sounds.  Abdominal:     Palpations: Abdomen is soft.     Tenderness: There is no guarding or rebound.     Comments: Mild epigastric tenderness  Musculoskeletal:        General: No tenderness.  Skin:    General: Skin is warm and dry.  Neurological:     Mental Status: She is alert and oriented to person, place, and time.  Psychiatric:        Behavior: Behavior normal.     ED Results / Procedures / Treatments   Labs (all labs ordered are listed, but only abnormal results are displayed) Labs Reviewed  COMPREHENSIVE METABOLIC PANEL -  Abnormal; Notable for the following components:      Result Value   Glucose, Bld 113 (*)    All other components within normal limits  URINALYSIS, ROUTINE W REFLEX MICROSCOPIC - Abnormal; Notable for the following components:   APPearance HAZY (*)    Hgb urine dipstick LARGE (*)    Protein, ur 30 (*)    All other components within normal limits  LIPASE, BLOOD  CBC  I-STAT BETA HCG BLOOD, ED ([facility], [facility], [facility] ONLY)    EKG None  Radiology No results found.  Procedures Procedures   Medications Ordered in ED Medications - No data to display  ED Course  I have reviewed the triage vital signs and the nursing notes.  Pertinent labs & imaging results that were available during my care of the patient were reviewed by me and considered in my medical decision making (see chart for details).    MDM Rules/Calculators/A&P                         patient here for evaluation of abdominal pain, vomiting and diarrhea. She has minimal tenderness on examination without peritoneal findings. Presentation is not  consistent with cholecystitis, pancreatitis, perforated viscous, appendicitis. She was treated with oral medications in the emergency department and upon reassessment she is feeling improved and able to tolerate PO. Discussed with patient home care for abdominal pain, possible gastroenteritis with outpatient follow-up and return precautions   Final Clinical Impression(s) / ED Diagnoses Final diagnoses:  None    Rx / DC Orders ED Discharge Orders    None       Tilden Fossa, MD 10/17/20 2343

## 2020-10-17 NOTE — ED Provider Notes (Signed)
Emergency Medicine Provider Triage Evaluation Note  Donna Trevino , a 40 y.o. female  was evaluated in triage.  Pt complains of abdominal pain for three days.  She has nausea, vomiting, and diarrhea. She states that she had 4 beers 3 days ago and wonders if this may be contributing.  The pain is worse in the upper abdomen.  No fevers.  No cough.  She feels dizzy which she attributes to dehydration. History obtained through [employer] video Spanish interpreter used by RN.Marland Kitchen  Review of Systems  Positive: Abdominal pain, N/V/D.  Negative: Fever, urinary symptoms  Physical Exam  BP 125/88 (BP Location: Right Arm)   Pulse 89   Temp (!) 97.4 F (36.3 C) (Oral)   Resp 16   SpO2 100%  Gen:   Awake, no distress   Resp:  Normal effort  MSK:   Moves extremities without difficulty  Other:  Abdomen is TTP primarily in the upper abdomen.    Medical Decision Making  Medically screening exam initiated at 4:57 PM.  Appropriate orders placed.  Misti Aguirre-Cortez was informed that the remainder of the evaluation will be completed by another provider, this initial triage assessment does not replace that evaluation, and the importance of remaining in the ED until their evaluation is complete.     Cristina Gong, PA-C 10/17/20 1659    Wynetta Fines, MD 10/17/20 2049

## 2020-10-17 NOTE — ED Notes (Signed)
Patient verbalizes understanding of discharge instructions. Opportunity for questioning and answers were provided. Armband removed by staff, pt discharged from ED via wheelchair.  

## 2020-10-17 NOTE — ED Triage Notes (Signed)
Per [employer] interpretor patient complains of abdominal pain x 3 days with nausea, vomiting and diarrhea. Describes the pain as sharp. LMP 6/3

## 2020-11-29 ENCOUNTER — Encounter: Payer: Self-pay | Admitting: *Deleted

## 2021-01-03 ENCOUNTER — Other Ambulatory Visit: Payer: Self-pay

## 2021-01-03 ENCOUNTER — Encounter: Payer: Self-pay | Admitting: Cardiovascular Disease

## 2021-01-03 DIAGNOSIS — Z1231 Encounter for screening mammogram for malignant neoplasm of breast: Secondary | ICD-10-CM

## 2021-01-03 NOTE — Progress Notes (Signed)
Cardiology Office Note:    Date:  01/04/2021   ID:  Donna Trevino, DOB 1981-03-21, MRN [id number]  PCP:  Pcp, No   [facility] Providers Cardiologist:  Jarrett Chicoine Click to update primary MD,subspecialty MD or APP then REFRESH:1}    Referring MD: Schorr, Roma Kayser, FNP   Chief Complaint  Patient presents with   Chest Pain   Dizziness    Aug. 23, 2022   Donna Trevino is a 40 y.o. female with a hx of dizziness and chest pain .   We were asked to see her for further evaluation of her chest pain and syncope  by Tama Gander, FNP  Seen with interpreter Madaline Guthrie  Episodes of dizziness  Left sided chest pain  Frequently has cp, not daily  Cp worsens with deep breath  Slightly worse with eating or drinking  Occurs with stress and when she is very tired . Still has this point like CP   Asked further questions about her syncope Occurred when she woke and , was dizzy Felt like she needed to vomit Ran to the bathroom and passed out  + seats  Woke up on the floor  Continued to feel dizzy and nauseated through the day .   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 2 tablets (2) times daily oral as needed   medroxyPROGESTERone (DEPO-SUBQ PROVERA 104) 104 MG/0.65ML injection Inject 104 mg into the skin every 3 (three) months.   Multiple Vitamin (MULTIVITAMIN) tablet Take 1 tablet by mouth daily.   ondansetron (ZOFRAN ODT) 4 MG disintegrating tablet Take 1 tablet (4 mg total) by mouth every 8 (eight) hours as needed for nausea or vomiting.     Allergies:   Penicillins    Social History   Socioeconomic History   Marital status: Single    Spouse name: Not on file   Number of children: Not on file   Years of education: Not on file   Highest education level: Not on file  Occupational History   Not on file  Tobacco Use   Smoking status: Never   Smokeless tobacco: Never  Substance and Sexual Activity   Alcohol use: No   Drug use: No   Sexual activity: Yes    Birth control/protection: None  Other Topics Concern   Not on file  Social History Narrative   Not on file   Social Determinants of Health   Financial Resource Strain: Not on file  Food Insecurity: Not on file  Transportation Needs: Not on file  Physical Activity: Not on file  Stress: Not on file  Social Connections: Not on file     Family History: The patient's family history is negative for Anesthesia problems.  ROS:   Please see the history of present illness.     All other systems reviewed and are negative.  EKGs/Labs/Other Studies Reviewed:    The following studies were reviewed today:   EKG: January 04, 2021: Normal sinus rhythm at 80.  No ST  or T wave abnormalities. Recent Labs: 10/17/2020: ALT 22; BUN 9; Creatinine, Ser 0.68; Hemoglobin 14.6; Platelets 292; Potassium 3.8; Sodium 141  Recent Lipid Panel No results found for: CHOL, TRIG, HDL, CHOLHDL, VLDL, LDLCALC, LDLDIRECT   Risk Assessment/Calculations:           Physical Exam:    VS:  BP 120/82   Pulse 80   Ht 5\' 4"  (1.626 m)   Wt 128 lb (58.1 kg)   SpO2 97%   BMI 21.97 kg/m     Wt Readings from Last 3 Encounters:  01/04/21 128 lb (58.1 kg)  06/15/11 147 lb (66.7 kg)     GEN:  Well nourished, well developed in no acute distress HEENT: Normal NECK: No JVD; No carotid bruits LYMPHATICS: No lymphadenopathy CARDIAC: RRR, no murmurs, rubs, gallops,   + rib tenderness below her left breast  RESPIRATORY:  Clear to auscultation without rales, wheezing or rhonchi  ABDOMEN: Soft, non-tender,  non-distended MUSCULOSKELETAL:  No edema; No deformity  SKIN: Warm and dry NEUROLOGIC:  Alert and oriented x 3 PSYCHIATRIC:  Normal affect   ASSESSMENT:    1. Costochondral chest pain   2. Vasovagal syncope    PLAN:       Costochondritis/chest pain: Her chest pain is as it is clearly rib tenderness.  Chest pain with a deep breath and pain with stretching and reaching.  On exam she has exquisite rib tenderness.  I suspect that she has costochondritis as an explanation for her chest pain.  EKG is normal.  This is not cardiac ischemia.  2.  Episode of syncope: She woke up sick 1 morning.  She thought that she was going to vomit.  She is very nauseated and lightheaded.  She try to make it to the bathroom but passed out along the way.  She woke up quite sweaty and on the floor.  She continued to have nausea with dry heaving through the day.  This episode clearly is related to a vagal response related to nausea and heaving.  I do not think there is any cardiac abnormality suggested.  We will see her on an as-needed basis.  She will follow-up with her primary medical doctor.        Medication Adjustments/Labs and Tests Ordered: Current medicines are reviewed at length with the patient today.  Concerns regarding medicines are outlined above.  Orders Placed This Encounter  Procedures   EKG 12-Lead    No orders of the defined types were placed in this encounter.   Patient Instructions  Medication Instructions:  Your physician recommends that you continue on your current medications as directed. Please refer to the Current Medication list given to you today.  *If you need a refill on your cardiac medications before your next appointment, please call your pharmacy*   Lab Work: None Ordered If you have labs (blood work) drawn today and your tests are completely normal, you will receive your results only by: MyChart Message (if you have MyChart) OR A paper copy in the mail If you  have any lab test that is abnormal or we need to change your treatment, we will call you to review the results.   Testing/Procedures: None Ordered   Follow-Up: At [facility], you and your health needs are our priority.  As part of our continuing mission to provide you with exceptional heart care, we have created designated Provider Care Teams.  These Care Teams include your primary Cardiologist (physician) and Advanced Practice Providers (  APPs -  Physician Assistants and Nurse Practitioners) who all work together to provide you with the care you need, when you need it.  We recommend signing up for the patient portal called "MyChart".  Sign up information is provided on this After Visit Summary.  MyChart is used to connect with patients for Virtual Visits (Telemedicine).  Patients are able to view lab/test results, encounter notes, upcoming appointments, etc.  Non-urgent messages can be sent to your provider as well.   To learn more about what you can do with MyChart, go to [email or web address].    Your next appointment:     As Needed  The format for your next appointment:   In Person or Virtual  Provider:   You may see Kristeen Miss, MD or one of the following Advanced Practice Providers on your designated Care Team:   Tereso Newcomer, PA-C Chelsea Aus, [place]    Signed, Kristeen Miss, MD  01/04/2021 1:42 PM    [facility]

## 2021-01-04 ENCOUNTER — Other Ambulatory Visit: Payer: Self-pay

## 2021-01-04 ENCOUNTER — Ambulatory Visit (INDEPENDENT_AMBULATORY_CARE_PROVIDER_SITE_OTHER): Payer: Self-pay | Admitting: Cardiovascular Disease

## 2021-01-04 ENCOUNTER — Encounter: Payer: Self-pay | Admitting: Cardiovascular Disease

## 2021-01-04 VITALS — BP 120/82 | HR 80 | Ht 64.0 in | Wt 128.0 lb

## 2021-01-04 DIAGNOSIS — M940 Chondrocostal junction syndrome [Tietze]: Secondary | ICD-10-CM

## 2021-01-04 DIAGNOSIS — R55 Syncope and collapse: Secondary | ICD-10-CM | POA: Insufficient documentation

## 2021-01-04 DIAGNOSIS — R0789 Other chest pain: Secondary | ICD-10-CM

## 2021-01-04 NOTE — Patient Instructions (Signed)
Medication Instructions:  Your physician recommends that you continue on your current medications as directed. Please refer to the Current Medication list given to you today.  *If you need a refill on your cardiac medications before your next appointment, please call your pharmacy*   Lab Work: None Ordered If you have labs (blood work) drawn today and your tests are completely normal, you will receive your results only by: MyChart Message (if you have MyChart) OR A paper copy in the mail If you have any lab test that is abnormal or we need to change your treatment, we will call you to review the results.   Testing/Procedures: None Ordered   Follow-Up: At [facility], you and your health needs are our priority.  As part of our continuing mission to provide you with exceptional heart care, we have created designated Provider Care Teams.  These Care Teams include your primary Cardiologist (physician) and Advanced Practice Providers (APPs -  Physician Assistants and Nurse Practitioners) who all work together to provide you with the care you need, when you need it.  We recommend signing up for the patient portal called "MyChart".  Sign up information is provided on this After Visit Summary.  MyChart is used to connect with patients for Virtual Visits (Telemedicine).  Patients are able to view lab/test results, encounter notes, upcoming appointments, etc.  Non-urgent messages can be sent to your provider as well.   To learn more about what you can do with MyChart, go to [email or web address].    Your next appointment:   As Needed  The format for your next appointment:   In Person or Virtual  Provider:   You may see Philip Nahser, MD or one of the following Advanced Practice Providers on your designated Care Team:   Scott Weaver, PA-C Vin Bhagat, PA-C   

## 2021-01-13 ENCOUNTER — Other Ambulatory Visit: Payer: Self-pay | Admitting: Obstetrics and Gynecology

## 2021-01-13 DIAGNOSIS — Z1231 Encounter for screening mammogram for malignant neoplasm of breast: Secondary | ICD-10-CM

## 2021-02-01 ENCOUNTER — Ambulatory Visit: Payer: Self-pay

## 2021-02-27 ENCOUNTER — Emergency Department (HOSPITAL_COMMUNITY)
Admission: EM | Admit: 2021-02-27 | Discharge: 2021-02-28 | Disposition: A | Discharge status: Home / Self Care | Payer: Self-pay | Attending: Student | Admitting: Student

## 2021-02-27 ENCOUNTER — Encounter (HOSPITAL_COMMUNITY): Payer: Self-pay | Admitting: Emergency Medicine

## 2021-02-27 ENCOUNTER — Other Ambulatory Visit: Payer: Self-pay

## 2021-02-27 DIAGNOSIS — N9489 Other specified conditions associated with female genital organs and menstrual cycle: Secondary | ICD-10-CM | POA: Insufficient documentation

## 2021-02-27 DIAGNOSIS — R1084 Generalized abdominal pain: Secondary | ICD-10-CM | POA: Insufficient documentation

## 2021-02-27 DIAGNOSIS — R112 Nausea with vomiting, unspecified: Secondary | ICD-10-CM | POA: Insufficient documentation

## 2021-02-27 DIAGNOSIS — R109 Unspecified abdominal pain: Secondary | ICD-10-CM

## 2021-02-27 LAB — CBC WITH DIFFERENTIAL/PLATELET
Abs Immature Granulocytes: 0.06 10*3/uL (ref 0.00–0.07)
Basophils Absolute: 0.1 10*3/uL (ref 0.0–0.1)
Basophils Relative: 1 %
Eosinophils Absolute: 0 10*3/uL (ref 0.0–0.5)
Eosinophils Relative: 0 %
HCT: 41.7 % (ref 36.0–46.0)
Hemoglobin: 13.9 g/dL (ref 12.0–15.0)
Immature Granulocytes: 1 %
Lymphocytes Relative: 11 %
Lymphs Abs: 1.2 10*3/uL (ref 0.7–4.0)
MCH: 31.7 pg (ref 26.0–34.0)
MCHC: 33.3 g/dL (ref 30.0–36.0)
MCV: 95 fL (ref 80.0–100.0)
Monocytes Absolute: 0.6 10*3/uL (ref 0.1–1.0)
Monocytes Relative: 6 %
Neutro Abs: 8.5 10*3/uL — ABNORMAL HIGH (ref 1.7–7.7)
Neutrophils Relative %: 81 %
Platelets: 305 10*3/uL (ref 150–400)
RBC: 4.39 MIL/uL (ref 3.87–5.11)
RDW: 11.6 % (ref 11.5–15.5)
WBC: 10.4 10*3/uL (ref 4.0–10.5)
nRBC: 0 % (ref 0.0–0.2)

## 2021-02-27 LAB — COMPREHENSIVE METABOLIC PANEL
ALT: 20 U/L (ref 0–44)
AST: 27 U/L (ref 15–41)
Albumin: 4.2 g/dL (ref 3.5–5.0)
Alkaline Phosphatase: 45 U/L (ref 38–126)
Anion gap: 12 (ref 5–15)
BUN: 5 mg/dL — ABNORMAL LOW (ref 6–20)
CO2: 24 mmol/L (ref 22–32)
Calcium: 9.1 mg/dL (ref 8.9–10.3)
Chloride: 103 mmol/L (ref 98–111)
Creatinine, Ser: 0.66 mg/dL (ref 0.44–1.00)
GFR, Estimated: >60 mL/min (ref >60)
Glucose, Bld: 102 mg/dL — ABNORMAL HIGH (ref 70–99)
Potassium: 3.6 mmol/L (ref 3.5–5.1)
Sodium: 139 mmol/L (ref 135–145)
Total Bilirubin: 0.6 mg/dL (ref 0.3–1.2)
Total Protein: 7.4 g/dL (ref 6.5–8.1)

## 2021-02-27 LAB — LIPASE, BLOOD: Lipase: 54 U/L — ABNORMAL HIGH (ref 11–51)

## 2021-02-27 LAB — I-STAT BETA HCG BLOOD, ED (MC, WL, AP ONLY): I-stat hCG, quantitative: <5 m[IU]/mL (ref <5)

## 2021-02-27 MED ORDER — ONDANSETRON 4 MG PO TBDP
8.0000 mg | ORAL_TABLET | Freq: Once | ORAL | Status: AC
  Administered 2021-02-28: 8 mg via ORAL
  Filled 2021-02-27: qty 2

## 2021-02-27 NOTE — ED Provider Notes (Signed)
Emergency Medicine Provider Triage Evaluation Note  Donna Trevino , a 40 y.o. female  was evaluated in triage.  Pt complains of epigastric pain.  Started yesterday, its been constant.  Patient is having nausea and emesis, unable to keep any food down.  Denies any change in bowel habits, no prior abdominal surgeries..  No history of pancreatitis, does not drink significant amount of alcohol.  Review of Systems  Positive: Epigastric pain, nausea, vomiting Negative: Fever  Physical Exam  BP 135/79 (BP Location: Left Arm)   Pulse 80   Temp 98.4 F (36.9 C) (Oral)   Resp 16   SpO2 100%  Gen:   Awake, no distress   Resp:  Normal effort  MSK:   Moves extremities without difficulty  Other:  Epigastric tenderness, no CVA tenderness  Medical Decision Making  Medically screening exam initiated at 3:16 PM.  Appropriate orders placed.  Jancie Aguirre-Cortez was informed that the remainder of the evaluation will be completed by another provider, this initial triage assessment does not replace that evaluation, and the importance of remaining in the ED until their evaluation is complete.  Labs   Theron Arista, [place] 02/27/21 1517    Benjiman Core, MD 02/27/21 2023

## 2021-02-27 NOTE — ED Triage Notes (Signed)
Pt reports mid upper abd pain/epigastric pain since yesterday with nausea and vomiting.  Denies diarrhea/constipation.

## 2021-02-28 MED ORDER — LIDOCAINE VISCOUS HCL 2 % MT SOLN
15.0000 mL | Freq: Once | OROMUCOSAL | Status: AC
  Administered 2021-02-28: 15 mL via ORAL
  Filled 2021-02-28: qty 15

## 2021-02-28 MED ORDER — ONDANSETRON 4 MG PO TBDP: 4.0000 mg | ORAL_TABLET | Freq: Three times a day (TID) | ORAL | 0 refills | Status: AC | PRN

## 2021-02-28 MED ORDER — ALUM & MAG HYDROXIDE-SIMETH 200-200-20 MG/5ML PO SUSP
30.0000 mL | Freq: Once | ORAL | Status: AC
  Administered 2021-02-28: 30 mL via ORAL
  Filled 2021-02-28: qty 30

## 2021-02-28 MED ORDER — ACETAMINOPHEN 500 MG PO TABS
1000.0000 mg | ORAL_TABLET | Freq: Once | ORAL | Status: AC
  Administered 2021-02-28: 1000 mg via ORAL
  Filled 2021-02-28: qty 2

## 2021-02-28 MED ORDER — MAALOX MAX 400-400-40 MG/5ML PO SUSP: 10.0000 mL | Freq: Four times a day (QID) | ORAL | 0 refills | Status: DC | PRN

## 2021-02-28 MED ORDER — HYOSCYAMINE SULFATE SL 0.125 MG SL SUBL: 1.0000 | SUBLINGUAL_TABLET | Freq: Four times a day (QID) | SUBLINGUAL | 0 refills | Status: DC | PRN

## 2021-02-28 MED ORDER — HYOSCYAMINE SULFATE 0.125 MG SL SUBL
0.2500 mg | SUBLINGUAL_TABLET | Freq: Once | SUBLINGUAL | Status: AC
  Administered 2021-02-28: 0.25 mg via SUBLINGUAL
  Filled 2021-02-28: qty 2

## 2021-02-28 NOTE — ED Provider Notes (Signed)
[facility] EMERGENCY DEPARTMENT Provider Note  CSN: [id number] Arrival date & time: 02/27/21 1434  Chief Complaint(s) Abdominal Pain  HPI Donna Trevino is a 40 y.o. female    Abdominal Pain Pain location:  Epigastric Pain quality: aching   Pain radiates to:  Does not radiate Pain severity:  Moderate Onset quality:  Gradual Duration:  1 day Timing:  Constant Progression:  Waxing and waning Chronicity:  New Context: suspicious food intake   Context: not alcohol use, not medication withdrawal, not previous surgeries, not recent illness and not sick contacts   Relieved by:  Nothing Worsened by:  Vomiting Associated symptoms: nausea and vomiting   Associated symptoms: no constipation, no diarrhea, no dysuria, no fatigue, no fever and no hematemesis    Past Medical History Past Medical History:  Diagnosis Date   Anxiety    No pertinent past medical history    Patient Active Problem List   Diagnosis Date Noted   Costochondritis 01/04/2021   Syncope and collapse 01/04/2021   Normal delivery 06/15/2011   Home Medication(s) Prior to Admission medications   Medication Sig Start Date End Date Taking? Authorizing Provider  alum & mag hydroxide-simeth (MAALOX MAX) 400-400-40 MG/5ML suspension Take 10 mLs by mouth every 6 (six) hours as needed for indigestion. 02/28/21  Yes Joelene Barriere, Amadeo Garnet, MD  Hyoscyamine Sulfate SL (LEVSIN/SL) 0.125 MG SUBL Place 1 each under the tongue 4 (four) times daily as needed for up to 5 days. 02/28/21 03/05/21 Yes Keath Matera, Amadeo Garnet, MD  ondansetron (ZOFRAN ODT) 4 MG disintegrating tablet Take 1 tablet (4 mg total) by mouth every 8 (eight) hours as needed for up to 3 days for nausea or vomiting. 02/28/21 03/03/21 Yes Kumiko Fishman, Amadeo Garnet, MD  ibuprofen (ADVIL) 200 MG tablet Take 200 mg by mouth every 6 (six) hours as needed. 2 tablets (2) times daily oral as needed    [provider]  medroxyPROGESTERone  (DEPO-SUBQ PROVERA 104) 104 MG/0.65ML injection Inject 104 mg into the skin every 3 (three) months.    [provider]  Multiple Vitamin (MULTIVITAMIN) tablet Take 1 tablet by mouth daily.    [provider]                                                                                                                                    Past Surgical History Past Surgical History:  Procedure Laterality Date   NO PAST SURGERIES     Family History Family History  Problem Relation Age of Onset   Anesthesia problems Neg Hx     Social History Social History   Tobacco Use   Smoking status: Never   Smokeless tobacco: Never  Substance Use Topics   Alcohol use: No   Drug use: No   Allergies Penicillins  Review of Systems Review of Systems  Constitutional:  Negative for fatigue and fever.  Gastrointestinal:  Positive for abdominal pain,  nausea and vomiting. Negative for constipation, diarrhea and hematemesis.  Genitourinary:  Negative for dysuria.  All other systems are reviewed and are negative for acute change except as noted in the HPI  Physical Exam Vital Signs  I have reviewed the triage vital signs BP 129/88 (BP Location: Right Arm)   Pulse 79   Temp 98 F (36.7 C) (Oral)   Resp 20   LMP 02/20/2021   SpO2 100%   Physical Exam Vitals reviewed.  Constitutional:      General: She is not in acute distress.    Appearance: She is well-developed. She is not diaphoretic.  HENT:     Head: Normocephalic and atraumatic.     Right Ear: External ear normal.     Left Ear: External ear normal.     Nose: Nose normal.  Eyes:     General: No scleral icterus.    Conjunctiva/sclera: Conjunctivae normal.  Neck:     Trachea: Phonation normal.  Cardiovascular:     Rate and Rhythm: Normal rate and regular rhythm.  Pulmonary:     Effort: Pulmonary effort is normal. No respiratory distress.     Breath sounds: No stridor.  Abdominal:     General: There is no  distension.     Tenderness: There is generalized abdominal tenderness (mild discomfort). There is no guarding or rebound.  Musculoskeletal:        General: Normal range of motion.     Cervical back: Normal range of motion.  Neurological:     Mental Status: She is alert and oriented to person, place, and time.  Psychiatric:        Behavior: Behavior normal.    ED Results and Treatments Labs (all labs ordered are listed, but only abnormal results are displayed) Labs Reviewed  CBC WITH DIFFERENTIAL/PLATELET - Abnormal; Notable for the following components:      Result Value   Neutro Abs 8.5 (*)    All other components within normal limits  COMPREHENSIVE METABOLIC PANEL - Abnormal; Notable for the following components:   Glucose, Bld 102 (*)    BUN 5 (*)    All other components within normal limits  LIPASE, BLOOD - Abnormal; Notable for the following components:   Lipase 54 (*)    All other components within normal limits  URINALYSIS, ROUTINE W REFLEX MICROSCOPIC  I-STAT BETA HCG BLOOD, ED ([facility], [facility], [facility] ONLY)                                                                                                                         EKG  EKG Interpretation  Date/Time:    Ventricular Rate:    PR Interval:    QRS Duration:   QT Interval:    QTC Calculation:   R Axis:     Text Interpretation:         Radiology No results found.  Pertinent labs & imaging results that were available during my care of the patient  were reviewed by me and considered in my medical decision making (see MDM for details).  Medications Ordered in ED Medications  acetaminophen (TYLENOL) tablet 1,000 mg (has no administration in time range)  ondansetron (ZOFRAN-ODT) disintegrating tablet 8 mg (8 mg Oral Given 02/28/21 0154)  alum & mag hydroxide-simeth (MAALOX/MYLANTA) 200-200-20 MG/5ML suspension 30 mL (30 mLs Oral Given 02/28/21 0205)    And  lidocaine (XYLOCAINE) 2 % viscous mouth solution 15 mL (15  mLs Oral Given 02/28/21 0205)  hyoscyamine (LEVSIN SL) SL tablet 0.25 mg (0.25 mg Sublingual Given 02/28/21 0152)                                                                                                                                     Procedures Procedures  (including critical care time)  Medical Decision Making / ED Course I have reviewed the nursing notes for this encounter and the patient's prior records (if available in EHR or on provided paperwork).  Donna Trevino was evaluated in Emergency Department on 02/28/2021 for the symptoms described in the history of present illness. She was evaluated in the context of the global COVID-19 pandemic, which necessitated consideration that the patient might be at risk for infection with the SARS-CoV-2 virus that causes COVID-19. Institutional protocols and algorithms that pertain to the evaluation of patients at risk for COVID-19 are in a state of rapid change based on information released by regulatory bodies including the CDC and federal and state organizations. These policies and algorithms were followed during the patient's care in the ED.     41 y.o. female presents with vomiting and generalized abdominal discomfort for 1 day.  Reports possible suspicious food intake.  Decreased oral tolerance. Rest of history as above.  Patient appears well, not in distress, and with no signs of toxicity or dehydration. Abdomen with mild generalized discomfort without evidence of peritonitis.  Rest of the exam as above  Patient seen the MSE process and had screening labs obtain that were grossly reassuring without leukocytosis or anemia.  No significant electrolyte derangements or renal sufficiency.  No evidence of bili obstruction.  Mildly elevated lipase but not consistent with acute pancreatitis.  Most consistent with viral gastroenteritis vs mild food poisoning.   Doubt appendicitis, diverticulitis, severe colitis, or SBO.  Patient  given ODT Zofran and GI cocktail. Pain improved significantly.  Able to tolerate oral intake in the ED.  Discussed symptomatic treatment with the patient and they will follow closely with their PCP.  Pertinent labs & imaging results that were available during my care of the patient were reviewed by me and considered in my medical decision making:    Final Clinical Impression(s) / ED Diagnoses Final diagnoses:  Nausea and vomiting in adult  Abdominal discomfort   The patient appears reasonably screened and/or stabilized for discharge and I doubt any other medical condition or other [facility] requiring further screening, evaluation, or treatment  in the ED at this time prior to discharge. Safe for discharge with strict return precautions.  Disposition: Discharge  Condition: Good  I have discussed the results, Dx and Tx plan with the patient/family who expressed understanding and agree(s) with the plan. Discharge instructions discussed at length. The patient/family was given strict return precautions who verbalized understanding of the instructions. No further questions at time of discharge.    ED Discharge Orders          Ordered    alum & mag hydroxide-simeth (MAALOX MAX) 400-400-40 MG/5ML suspension  Every 6 hours PRN        02/28/21 0226    ondansetron (ZOFRAN ODT) 4 MG disintegrating tablet  Every 8 hours PRN        02/28/21 0226    Hyoscyamine Sulfate SL (LEVSIN/SL) 0.125 MG SUBL  4 times daily PRN        02/28/21 0226             Follow Up: Primary care provider  Call  to schedule an appointment for close follow up     This chart was dictated using voice recognition software.  Despite best efforts to proofread,  errors can occur which can change the documentation meaning.    Nira Conn, MD 02/28/21 [phone]

## 2021-03-02 DIAGNOSIS — K5900 Constipation, unspecified: Secondary | ICD-10-CM | POA: Insufficient documentation

## 2021-03-05 ENCOUNTER — Emergency Department (HOSPITAL_COMMUNITY)
Admission: EM | Admit: 2021-03-05 | Discharge: 2021-03-05 | Disposition: A | Discharge status: Home / Self Care | Payer: Self-pay | Attending: Emergency Medicine | Admitting: Emergency Medicine

## 2021-03-05 ENCOUNTER — Encounter (HOSPITAL_COMMUNITY): Payer: Self-pay

## 2021-03-05 ENCOUNTER — Emergency Department (HOSPITAL_COMMUNITY): Payer: Self-pay

## 2021-03-05 DIAGNOSIS — R112 Nausea with vomiting, unspecified: Secondary | ICD-10-CM | POA: Insufficient documentation

## 2021-03-05 DIAGNOSIS — R197 Diarrhea, unspecified: Secondary | ICD-10-CM | POA: Insufficient documentation

## 2021-03-05 DIAGNOSIS — K5900 Constipation, unspecified: Secondary | ICD-10-CM | POA: Insufficient documentation

## 2021-03-05 LAB — POC OCCULT BLOOD, ED: Fecal Occult Bld: NEGATIVE

## 2021-03-05 NOTE — Discharge Instructions (Addendum)
Aumente su ingesta de lquidos, le recomiendo al menos 6-8 vasos de Warehouse manager. Aumente su ingesta de alimentos segn lo tolere, especialmente alimentos con alto contenido de [place], como verduras, frutas. Puede usar Mira[employer] ONE[employer] veces al da todos los das hasta que tenga una evacuacin intestinal.   Si contina teniendo dificultad para defecar, despus de varios das ms, realice un seguimiento con gastroenterologa o regrese al departamento de emergencias.

## 2021-03-05 NOTE — ED Provider Notes (Signed)
[facility]-EMERGENCY DEPT Provider Note   CSN: [id number] Arrival date & time: 03/05/21  0804     History Chief Complaint  Patient presents with   Constipation    Donna Trevino is a 40 y.o. female with no significant past medical history who presents with 3 days of constipation with obstipation.  Patient reports that she was recently seen in the hospital on 16 October for nausea, vomiting, diarrhea, and felt unwell at that time.  Her work-up at that time was significant for normal lab values including CBC, CMP, lipase.  Patient with improvement of abdominal pain, nausea with Zofran, GI cocktail.  Patient was able to tolerate p.o. intake and discharged that day.  Patient now complaining of 3 days of constipation, without nausea, vomiting, narcotic use.  Patient reports last formed bowel movement was around 3 days ago.  Patient denies any blood in her stools.  Patient reports she has generalized abdominal pain secondary to the constipation.  Patient reports that she has taken several laxatives.  Patient does report that she has had poor p.o. intake of both food and fluids.  She also endorses a history of constipation.   Constipation Associated symptoms: abdominal pain       Past Medical History:  Diagnosis Date   Anxiety    No pertinent past medical history     Patient Active Problem List   Diagnosis Date Noted   Costochondritis 01/04/2021   Syncope and collapse 01/04/2021   Normal delivery 06/15/2011    Past Surgical History:  Procedure Laterality Date   NO PAST SURGERIES       OB History     Gravida  3   Para  3   Term  3   Preterm      AB      Living  3      SAB      IAB      Ectopic      Multiple      Live Births  1           Family History  Problem Relation Age of Onset   Anesthesia problems Neg Hx     Social History   Tobacco Use   Smoking status: Never   Smokeless tobacco: Never  Substance Use Topics    Alcohol use: No   Drug use: No    Home Medications Prior to Admission medications   Medication Sig Start Date End Date Taking? Authorizing Provider  alum & mag hydroxide-simeth (MAALOX MAX) 400-400-40 MG/5ML suspension Take 10 mLs by mouth every 6 (six) hours as needed for indigestion. 02/28/21  Yes Cardama, Amadeo Garnet, MD  Hyoscyamine Sulfate SL (LEVSIN/SL) 0.125 MG SUBL Place 1 each under the tongue 4 (four) times daily as needed for up to 5 days. 02/28/21 03/05/21 Yes Cardama, Amadeo Garnet, MD  medroxyPROGESTERone (DEPO-SUBQ PROVERA 104) 104 MG/0.65ML injection Inject 104 mg into the skin every 3 (three) months.   Yes [provider]  Multiple Vitamin (MULTIVITAMIN) tablet Take 1 tablet by mouth daily.   Yes [provider]  ibuprofen (ADVIL) 200 MG tablet Take 200 mg by mouth every 6 (six) hours as needed. 2 tablets (2) times daily oral as needed Patient not taking: Reported on 03/05/2021    [provider]    Allergies    Penicillins  Review of Systems   Review of Systems  Gastrointestinal:  Positive for abdominal pain and constipation.  All other systems reviewed and are  negative.  Physical Exam Updated Vital Signs BP 106/72   Pulse (!) 102   Temp 98.6 F (37 C) (Oral)   Resp 18   LMP 02/20/2021   SpO2 99%   Physical Exam Vitals and nursing note reviewed.  Constitutional:      General: She is not in acute distress.    Appearance: Normal appearance.  HENT:     Head: Normocephalic and atraumatic.  Eyes:     General:        Right eye: No discharge.        Left eye: No discharge.  Cardiovascular:     Rate and Rhythm: Normal rate and regular rhythm.     Heart sounds: No murmur heard.   No friction rub. No gallop.  Pulmonary:     Effort: Pulmonary effort is normal.     Breath sounds: Normal breath sounds.  Abdominal:     General: Bowel sounds are normal.     Palpations: Abdomen is soft.     Comments: Generalized tenderness to  palpation throughout entire abdomen without any masses palpated.  No rebound, rigidity, guarding.  No discoloration, hemorrhage.  Bowel sounds auscultated in all 4 quadrants.  Genitourinary:    Comments: Rectal exam reveals moderate amount of stool in rectal vault, stool is soft, not impacted, guaiac is negative.  No obstructing mass or stricture noted. Skin:    General: Skin is warm and dry.     Capillary Refill: Capillary refill takes less than 2 seconds.  Neurological:     Mental Status: She is alert and oriented to person, place, and time.  Psychiatric:        Mood and Affect: Mood normal.        Behavior: Behavior normal.    ED Results / Procedures / Treatments   Labs (all labs ordered are listed, but only abnormal results are displayed) Labs Reviewed  POC OCCULT BLOOD, ED    EKG None  Radiology DG Abdomen 1 View  Result Date: 03/05/2021 CLINICAL DATA:  Constipation for 2 days. EXAM: ABDOMEN - 1 VIEW COMPARISON:  CT of the abdomen and pelvis on 01/25/2006 FINDINGS: Bowel gas pattern is nonobstructive. There is moderate stool burden, with significant rectosigmoid stool. There is no free intraperitoneal air. No evidence for small bowel obstruction. Visualized osseous structures have a normal appearance. IMPRESSION: Moderate stool burden.  Significant stool in the rectosigmoid. Electronically Signed   By: Norva Pavlov M.D.   On: 03/05/2021 10:45    Procedures Procedures   Medications Ordered in ED Medications - No data to display  ED Course  I have reviewed the triage vital signs and the nursing notes.  Pertinent labs & imaging results that were available during my care of the patient were reviewed by me and considered in my medical decision making (see chart for details).    MDM Rules/Calculators/A&P                         Patient with recent GI illness, poor p.o. intake, poor fluid intake presenting with 3 days of constipation.  Patient has some symptoms of  obstipation.  Will obtain radiograph of the abdomen to assess stool burden, assess for possible obstruction.  Will perform rectal exam to ensure that there is no obstructing mass in the rectum.  Examination as described above reveals moderate stool burden, especially in rectosigmoid colon.  Rectal exam reveals soft stool in rectal vault, no evidence of obstructing mass,  or stricture.  Minimal concern for surgical abdomen including bowel obstruction, mesenteric ischemia, or other intra-abdominal abnormality.  Patient with signs and symptoms of constipation, it has only been 3 days.  Encouraged increased fluid intake, increase fiber intake, increase laxative use.  Return precautions given.  Patient understands and agrees to plan.  Patient is discharged stable condition. Final Clinical Impression(s) / ED Diagnoses Final diagnoses:  Constipation, unspecified constipation type    Rx / DC Orders ED Discharge Orders     None        Olene Floss, PA-C 03/05/21 1113    Mancel Bale, MD 03/06/21 [phone]

## 2021-03-05 NOTE — ED Triage Notes (Signed)
Pt arrived via POV, c/o constipation x2 days. Has tried laxatives at home with no relief.

## 2021-03-31 ENCOUNTER — Ambulatory Visit
Admission: RE | Admit: 2021-03-31 | Discharge: 2021-03-31 | Disposition: A | Discharge status: Home / Self Care | Payer: No Typology Code available for payment source | Source: Ambulatory Visit | Attending: Obstetrics and Gynecology | Admitting: Obstetrics and Gynecology

## 2021-03-31 ENCOUNTER — Ambulatory Visit: Payer: Self-pay | Admitting: *Deleted

## 2021-03-31 ENCOUNTER — Other Ambulatory Visit: Payer: Self-pay

## 2021-03-31 VITALS — BP 118/82 | Wt 119.7 lb

## 2021-03-31 DIAGNOSIS — Z1239 Encounter for other screening for malignant neoplasm of breast: Secondary | ICD-10-CM

## 2021-03-31 DIAGNOSIS — Z1231 Encounter for screening mammogram for malignant neoplasm of breast: Secondary | ICD-10-CM

## 2021-03-31 NOTE — Progress Notes (Signed)
[name] is a 40 y.o. female who presents to [facility] clinic today with no complaints.    Pap Smear: Pap smear not completed today. Last Pap smear was 02/04/2020 at the [facility] clinic and was normal with negative HPV. Per patient has no history of an abnormal Pap smear. Last Pap smear result is not available in Epic. Last Pap smear result will be scanned into Epic.   Physical exam: Breasts Breasts symmetrical. No skin abnormalities bilateral breasts. Bilateral nipples slightly inverted that per patient is normal for her. No nipple discharge bilateral breasts. No lymphadenopathy. No lumps palpated bilateral breasts. No complaints of pain or tenderness on exam.      Pelvic/Bimanual Pap is not indicated today per BCCCP guidelines.   Smoking History: Patient has never smoked.   Patient Navigation: Patient education provided. Access to services provided for patient through [place] program. Spanish interpreter Natale Lay from [facility] provided.   Breast and Cervical Cancer Risk Assessment: Patient does not have family history of breast cancer, known genetic mutations, or radiation treatment to the chest before age 25. Patient does not have history of cervical dysplasia, immunocompromised, or DES exposure in-utero.  Risk Assessment     Risk Scores       03/31/2021   Last edited by: Meryl Dare, CMA   5-year risk: 0.2 %   Lifetime risk: 4.7 %            A: No complaints.  P: Referred patient to the [facility] for a screening mammogram on mobile unit. Appointment scheduled Thursday, March 31, 2021 at 0920.  Priscille Heidelberg, RN 03/31/2021 8:57 AM

## 2021-03-31 NOTE — Patient Instructions (Signed)
Explained breast self awareness with International aid/development worker. Patient did not need a Pap smear today due to last Pap smear and HPV typing was 02/04/2020. Let her know BCCCP will cover Pap smears and HPV typing every 5 years unless has a history of abnormal Pap smears. Referred patient to the [facility] for a screening mammogram on mobile unit. Appointment scheduled Thursday, March 31, 2021 at 0920. Patient escorted to the mobile unit following BCCCP appointment for her screening mammogram. Let patient know the Breast Center will follow up with her within the next couple weeks with results of mammogram by letter or phone. Donna Trevino verbalized understanding.  Donna Trevino, [name], RN 8:57 AM

## 2021-04-04 ENCOUNTER — Other Ambulatory Visit: Payer: Self-pay | Admitting: Obstetrics and Gynecology

## 2021-04-04 DIAGNOSIS — R928 Other abnormal and inconclusive findings on diagnostic imaging of breast: Secondary | ICD-10-CM

## 2021-04-21 ENCOUNTER — Ambulatory Visit
Admission: RE | Admit: 2021-04-21 | Discharge: 2021-04-21 | Disposition: A | Discharge status: Home / Self Care | Payer: No Typology Code available for payment source | Source: Ambulatory Visit | Attending: Obstetrics and Gynecology | Admitting: Obstetrics and Gynecology

## 2021-04-21 ENCOUNTER — Other Ambulatory Visit: Payer: Self-pay | Admitting: Obstetrics and Gynecology

## 2021-04-21 DIAGNOSIS — R928 Other abnormal and inconclusive findings on diagnostic imaging of breast: Secondary | ICD-10-CM

## 2021-04-21 DIAGNOSIS — R921 Mammographic calcification found on diagnostic imaging of breast: Secondary | ICD-10-CM

## 2021-05-05 ENCOUNTER — Other Ambulatory Visit: Payer: No Typology Code available for payment source

## 2021-05-23 ENCOUNTER — Ambulatory Visit
Admission: RE | Admit: 2021-05-23 | Discharge: 2021-05-23 | Disposition: A | Discharge status: Home / Self Care | Payer: No Typology Code available for payment source | Source: Ambulatory Visit | Attending: Obstetrics and Gynecology | Admitting: Obstetrics and Gynecology

## 2021-05-23 DIAGNOSIS — R921 Mammographic calcification found on diagnostic imaging of breast: Secondary | ICD-10-CM

## 2021-05-23 HISTORY — PX: BREAST BIOPSY: SHX20

## 2021-05-30 ENCOUNTER — Ambulatory Visit: Payer: Self-pay

## 2021-05-30 ENCOUNTER — Other Ambulatory Visit: Payer: Self-pay

## 2021-06-13 ENCOUNTER — Encounter (INDEPENDENT_AMBULATORY_CARE_PROVIDER_SITE_OTHER): Payer: Self-pay

## 2021-06-13 ENCOUNTER — Inpatient Hospital Stay: Payer: Self-pay | Attending: Obstetrics and Gynecology | Admitting: *Deleted

## 2021-06-13 ENCOUNTER — Other Ambulatory Visit: Payer: Self-pay

## 2021-06-13 VITALS — BP 107/71 | Ht 65.0 in | Wt 115.0 lb

## 2021-06-13 DIAGNOSIS — Z0000 Encounter for general adult medical examination without abnormal findings: Secondary | ICD-10-CM

## 2021-06-13 NOTE — Progress Notes (Signed)
[facility] initial screening   Interpreter- Natale Lay, Haroldine Laws   Clinical Measurement:  Vitals:   06/13/21 0836  BP: 101/78   Fasting Labs Drawn Today, will review with patient when they result.   Medical History:  Patient states that she does not have high cholesterol, does not have high blood pressure and she does not have diabetes.  Medications:  Patient states that she does not take medication to lower cholesterol, blood pressure or blood sugar.  Patient does not take an aspirin a day to help prevent a heart attack or stroke.    Blood pressure, self measurement: Patient states that she does not measure blood pressure from home. She checks her blood pressure N/A. She shares her readings with a health care provider: N/A.   Nutrition: Patient states that on average she eats 3 cups of fruit and 1 cups of vegetables per day. Patient states that she does not eat fish at least 2 times per week. Patient eats less than half servings of whole grains. Patient drinks less than 36 ounces of beverages with added sugar weekly: no. Patient is currently watching sodium or salt intake: yes. In the past 7 days patient has consumed drinks containing alcohol on 0 days. On a day that patient consumes drinks containing alcohol on average 0 drinks are consumed.      Physical activity:  Patient states that she gets 120 minutes of moderate and 120 minutes of vigorous physical activity each week.  Smoking status:  Patient states that she has has never smoked .   Quality of life:  Over the past 2 weeks patient states that she had little interest or pleasure in doing things: several days. She has been feeling down, depressed or hopeless:not at all.    Risk reduction and counseling:   Health Coaching: Spoke with patient about the daily recommendation for fruits and vegetables. (2 cups of fruit and 3 cups of vegetables) Showed patient what a serving size would look like. Patient consumes a serving of fish on  average every other week. Encouraged patient if possible to try and add in at least a serving of heart healthy fish weekly. Patient consumes whole wheat bread and whole grain cereals. Explained to patient the benefits of adding whole grains in regular diet. Patient currently consumes 1 can of coke a day. Explained to patient about the weekly recommendation for beverages with added sugars.   Goals: Patient would like to work on mental and emotional health over the next several months. Patient would like to work with a counselor or therapist in order to achieve this goal.   Navigation:  I will notify patient of lab results.  Patient is aware of 2 more health coaching sessions and a follow up. Gave patient information for [employer] of the [place] for counseling services.   Time: 20 minutes

## 2021-06-14 LAB — HEMOGLOBIN A1C
Est. average glucose Bld gHb Est-mCnc: 105 mg/dL
Hgb A1c MFr Bld: 5.3 % (ref 4.8–5.6)

## 2021-06-14 LAB — LIPID PANEL
Chol/HDL Ratio: 2.9 ratio (ref 0.0–4.4)
Cholesterol, Total: 170 mg/dL (ref 100–199)
HDL: 58 mg/dL (ref >39)
LDL Chol Calc (NIH): 100 mg/dL — ABNORMAL HIGH (ref 0–99)
Triglycerides: 63 mg/dL (ref 0–149)
VLDL Cholesterol Cal: 12 mg/dL (ref 5–40)

## 2021-06-14 LAB — GLUCOSE, RANDOM: Glucose: 85 mg/dL (ref 70–99)

## 2021-06-20 ENCOUNTER — Telehealth: Payer: Self-pay

## 2021-06-20 NOTE — Telephone Encounter (Signed)
Health coaching 2   interpreter- Natale Lay, [facility]   Labs- 170 cholesterol, 100 LDL cholesterol, 63 triglycerides, 58 HDL cholesterol, 5.3 hemoglobin A1C, 85 mean plasma glucose. Patient understands and is aware of her lab results.   Goals-  1. Reduce the amount of fried and fatty foods consumed. Try to grill, bake, broil or sautee foods instead. 2. Reduce the amount of red meats consumed. Substitute for lean proteins like: chicken, fish, or [place]. 3. Reduce the amount of whole fat dairy products consumed. Substitute for low-fat or reduced fat options instead.  4. Continue to try and maintain 20-30 minutes of daily exercise.  Navigation:  Patient is aware of 1 more health coaching sessions and a follow up.   Time-  10 minutes

## 2022-01-18 ENCOUNTER — Telehealth: Payer: Self-pay

## 2022-01-18 NOTE — Telephone Encounter (Signed)
Health Coaching 3  interpreter- Natale Lay, [facility]   Goals-  Patient has been walking outdoors or on her treadmill 3 days a week for 30 minutes. Patient has also been taking supplements that have improved the way she feels.    New goal- Exercise 5 days a week for 30 minutes.   Barrier to reaching goal-    Strategies to overcome-    Navigation:  Patient is aware of  a follow up session.   Time-  10 minutes

## 2022-02-08 ENCOUNTER — Other Ambulatory Visit: Payer: Self-pay

## 2022-02-08 DIAGNOSIS — Z1231 Encounter for screening mammogram for malignant neoplasm of breast: Secondary | ICD-10-CM

## 2022-03-28 ENCOUNTER — Ambulatory Visit: Payer: No Typology Code available for payment source

## 2022-03-28 ENCOUNTER — Inpatient Hospital Stay: Admission: RE | Admit: 2022-03-28 | Payer: No Typology Code available for payment source | Source: Ambulatory Visit

## 2022-04-25 ENCOUNTER — Other Ambulatory Visit: Payer: Self-pay | Admitting: Family Medicine

## 2022-04-25 DIAGNOSIS — N920 Excessive and frequent menstruation with regular cycle: Secondary | ICD-10-CM

## 2022-04-27 ENCOUNTER — Ambulatory Visit
Admission: RE | Admit: 2022-04-27 | Discharge: 2022-04-27 | Disposition: A | Discharge status: Home / Self Care | Payer: No Typology Code available for payment source | Source: Ambulatory Visit | Attending: Obstetrics and Gynecology | Admitting: Obstetrics and Gynecology

## 2022-04-27 ENCOUNTER — Ambulatory Visit: Payer: Self-pay | Admitting: Hematology and Oncology

## 2022-04-27 VITALS — BP 115/76 | Wt 124.0 lb

## 2022-04-27 DIAGNOSIS — Z1231 Encounter for screening mammogram for malignant neoplasm of breast: Secondary | ICD-10-CM

## 2022-04-27 NOTE — Patient Instructions (Signed)
Taught [name] about self breast awareness and gave educational materials to take home. Patient did not need a Pap smear today due to last Pap smear was in 2021 per patient. Let her know BCCCP will cover Pap smears every 5 years unless has a history of abnormal Pap smears. Referred patient to the [facility] for screening mammogram. Appointment scheduled for 04/27/22. Patient aware of appointment and will be there. Let patient know will follow up with her within the next couple weeks with results. Donna Trevino verbalized understanding.  Pascal Lux, NP 9:23 AM

## 2022-04-27 NOTE — Progress Notes (Signed)
Donna Trevino is a 41 y.o. female who presents to [facility] clinic today with no complaints.    Pap Smear: Pap not smear completed today. Last Pap smear was 2021 and was normal. Per patient has no history of an abnormal Pap smear. Last Pap smear result is not available in Epic.   Physical exam: Breasts Breasts symmetrical. No skin abnormalities bilateral breasts. No nipple retraction bilateral breasts. No nipple discharge bilateral breasts. No lymphadenopathy. No lumps palpated bilateral breasts.    MM RT BREAST BX W LOC DEV 1ST LESION IMAGE BX SPEC STEREO GUIDE  Addendum Date: 05/24/2021   ADDENDUM REPORT: 05/24/2021 15:05 ADDENDUM: Pathology revealed COLUMNAR CELL AND FIBROCYSTIC CHANGES WITH CALCIFICATIONS of the RIGHT breast, upper inner, (ribbon clip). This was found to be concordant by Dr. Laveda Abbe. Pathology results were discussed with the patient by telephone by Durene Cal, Bilingual Patient Services Representative. The patient reported doing well after the biopsy with tenderness at the site. Post biopsy instructions and care were reviewed and questions were answered. The patient was encouraged to call The [facility] for any additional concerns. The patient was instructed to return for annual screening mammography in November 2023. Pathology results reported by Rene Kocher, RN on 05/24/2021. Electronically Signed   By: Harmon Pier M.D.   On: 05/24/2021 15:05   Result Date: 05/24/2021 CLINICAL DATA:  41 year old female for tissue sampling of 0.8 cm group of UPPER INNER RIGHT breast calcifications. EXAM: RIGHT BREAST STEREOTACTIC CORE NEEDLE BIOPSY COMPARISON:  Previous exams. FINDINGS: The patient and I discussed the procedure of stereotactic-guided biopsy including benefits and alternatives. We discussed the high likelihood of a successful procedure. We discussed the risks of the procedure including infection, bleeding, tissue injury, clip migration, and  inadequate sampling. Informed written consent was given. The usual time out protocol was performed immediately prior to the procedure. Using sterile technique and 1% Lidocaine with and without epinephrine as local anesthetic, under stereotactic guidance, a 9 gauge vacuum assisted device was used to perform core needle biopsy of the 0.8 cm group of UPPER INNER RIGHT breast calcifications using a SUPERIOR approach. Specimen radiograph was performed showing calcifications. Specimens with calcifications are identified for pathology. Lesion quadrant: UPPER INNER RIGHT breast At the conclusion of the procedure, a RIBBON shaped tissue marker clip was deployed into the biopsy cavity. Follow-up 2-view mammogram was performed and dictated separately. IMPRESSION: Stereotactic-guided biopsy of 0.8 cm group of UPPER INNER RIGHT breast calcifications. No apparent complications. Electronically Signed: By: Harmon Pier M.D. On: 05/23/2021 08:27  MM CLIP PLACEMENT RIGHT  Result Date: 05/23/2021 CLINICAL DATA:  Evaluate RIBBON biopsy clip placement following 3D/stereotactic guided RIGHT breast biopsy. EXAM: 3D DIAGNOSTIC RIGHT MAMMOGRAM POST STEREOTACTIC BIOPSY COMPARISON:  Previous exam(s). FINDINGS: 3D Mammographic images were obtained following 3D/stereotactic guided biopsy of 0.8 cm group of UPPER INNER RIGHT breast calcifications. The RIBBON biopsy marking clip is in expected position at the site of biopsy. IMPRESSION: Appropriate positioning of the RIBBON shaped biopsy marking clip at the site of biopsy in the UPPER INNER RIGHT breast. Final Assessment: Post Procedure Mammograms for Marker Placement Electronically Signed   By: Harmon Pier M.D.   On: 05/23/2021 08:29  MS DIGITAL DIAG TOMO BILAT  Result Date: 04/21/2021 CLINICAL DATA:  Patient recalled from screening for bilateral asymmetries and right breast calcifications. EXAM: DIGITAL DIAGNOSTIC BILATERAL MAMMOGRAM WITH TOMOSYNTHESIS AND CAD; ULTRASOUND RIGHT BREAST  LIMITED; ULTRASOUND LEFT BREAST LIMITED TECHNIQUE: Bilateral digital diagnostic mammography and breast tomosynthesis  was performed. The images were evaluated with computer-aided detection.; Targeted ultrasound examination of the right breast was performed; Targeted ultrasound examination of the left breast was performed. COMPARISON:  Previous exam(s). ACR Breast Density Category c: The breast tissue is heterogeneously dense, which may obscure small masses. FINDINGS: Magnification views of the right breast were obtained. There is an 8 mm group of punctate calcifications within the medial right breast middle depth. Asymmetry within the right breast resolved with additional imaging compatible with dense tissue. Asymmetry within the superior left breast predominately resolved with additional imaging suggestive of dense fibroglandular tissue. A small amount of discharge from the right breast was noted during the mammogram examination. On physical exam, no discharge was elicited with physical examination of the right breast. Targeted ultrasound is performed, showing no retroareolar dilated duct or intraductal mass within the right breast. Normal dense tissue within the upper-outer left breast. IMPRESSION: 1. Suspicious calcifications within the medial right breast. 2. No suspicious abnormality within the left breast. 3. A small amount of discharge was noted during the mammographic examination of the right breast. No subsequent discharge was identified with physical examination. The patient states she has never noted discharge in the past. Normal retroareolar ultrasound. RECOMMENDATION: 1. Stereotactic guided core needle biopsy suspicious right breast calcifications. 2. Patient was instructed to return for additional evaluation should she ever notice spontaneous clear or bloody discharge from either breast. I have discussed the findings and recommendations with the patient. If applicable, a reminder letter will be sent to  the patient regarding the next appointment. BI-RADS CATEGORY  4: Suspicious. Electronically Signed   By: Annia Belt M.D.   On: 04/21/2021 09:13  MM 3D SCREEN BREAST BILATERAL  Result Date: 04/01/2021 CLINICAL DATA:  Screening. EXAM: DIGITAL SCREENING BILATERAL MAMMOGRAM WITH TOMOSYNTHESIS AND CAD TECHNIQUE: Bilateral screening digital craniocaudal and mediolateral oblique mammograms were obtained. Bilateral screening digital breast tomosynthesis was performed. The images were evaluated with computer-aided detection. COMPARISON:  None. ACR Breast Density Category c: The breast tissue is heterogeneously dense, which may obscure small masses. FINDINGS: In the right breast asymmetry with associated microcalcifications requires further evaluation. In the left breast asymmetry requires further evaluation. IMPRESSION: Further evaluation is suggested for possible asymmetry with associated microcalcifications in the right breast. Further evaluation is suggested for possible asymmetry in the left breast. RECOMMENDATION: Diagnostic mammogram and possibly ultrasound of both breasts. (Code:[id number]-B-39M) The patient will be contacted regarding the findings, and additional imaging will be scheduled. BI-RADS CATEGORY  0: Incomplete. Need additional imaging evaluation and/or prior mammograms for comparison. Electronically Signed   By: Ted Mcalpine M.D.   On: 04/01/2021 16:35       Pelvic/Bimanual Pap is not indicated today    Smoking History: Patient has never smoked and was not referred to quit line.    Patient Navigation: Patient education provided. Access to services provided for patient through BCCCP program. Natale Lay interpreter provided. No transportation provided   Colorectal Cancer Screening: Per patient has never had colonoscopy completed No complaints today.    Breast and Cervical Cancer Risk Assessment: Patient does not have family history of breast cancer, known genetic mutations, or  radiation treatment to the chest before age 8. Patient does not have history of cervical dysplasia, immunocompromised, or DES exposure in-utero.  Risk Scores as of 04/27/2022     Dondra Spry           5-year 0.68 %   Lifetime 8.01 %   This patient is [place]/Latina but has no  documented birth country, so the [place] model used data from [place] patients to calculate their risk score. Document a birth country in the Demographics activity for a more accurate score.         Last calculated by Meryl Dare, CMA on 04/27/2022 at  9:05 AM        A: BCCCP exam without pap smear No complaints with benign exam.   P: Referred patient to the [facility] [place] for a screening mammogram. Appointment scheduled 04/27/22.  Ilda Basset A, NP 04/27/2022 9:20 AM

## 2022-06-02 ENCOUNTER — Ambulatory Visit
Admission: RE | Admit: 2022-06-02 | Discharge: 2022-06-02 | Disposition: A | Discharge status: Home / Self Care | Payer: No Typology Code available for payment source | Source: Ambulatory Visit | Attending: Family Medicine | Admitting: Family Medicine

## 2022-06-02 DIAGNOSIS — N920 Excessive and frequent menstruation with regular cycle: Secondary | ICD-10-CM

## 2022-09-28 ENCOUNTER — Other Ambulatory Visit: Payer: Self-pay | Admitting: Obstetrics & Gynecology

## 2022-09-28 DIAGNOSIS — Z1231 Encounter for screening mammogram for malignant neoplasm of breast: Secondary | ICD-10-CM

## 2023-05-24 ENCOUNTER — Ambulatory Visit
Admission: RE | Admit: 2023-05-24 | Discharge: 2023-05-24 | Disposition: A | Discharge status: Home / Self Care | Payer: No Typology Code available for payment source | Source: Ambulatory Visit | Attending: Obstetrics & Gynecology | Admitting: Obstetrics & Gynecology

## 2023-05-24 DIAGNOSIS — Z1231 Encounter for screening mammogram for malignant neoplasm of breast: Secondary | ICD-10-CM

## 2023-11-27 ENCOUNTER — Encounter (HOSPITAL_COMMUNITY): Payer: Self-pay | Admitting: Internal Medicine

## 2023-11-27 ENCOUNTER — Inpatient Hospital Stay (HOSPITAL_COMMUNITY)
Admission: EM | Admit: 2023-11-27 | Discharge: 2023-12-01 | DRG: 418 | Disposition: A | Discharge status: Home / Self Care | Payer: Self-pay | Attending: Family Medicine | Admitting: Family Medicine

## 2023-11-27 ENCOUNTER — Emergency Department (HOSPITAL_COMMUNITY)

## 2023-11-27 ENCOUNTER — Other Ambulatory Visit: Payer: Self-pay

## 2023-11-27 DIAGNOSIS — Z555 Less than a high school diploma: Secondary | ICD-10-CM

## 2023-11-27 DIAGNOSIS — E876 Hypokalemia: Secondary | ICD-10-CM | POA: Diagnosis not present

## 2023-11-27 DIAGNOSIS — K859 Acute pancreatitis without necrosis or infection, unspecified: Principal | ICD-10-CM | POA: Diagnosis present

## 2023-11-27 DIAGNOSIS — R7989 Other specified abnormal findings of blood chemistry: Secondary | ICD-10-CM | POA: Diagnosis present

## 2023-11-27 DIAGNOSIS — Z800 Family history of malignant neoplasm of digestive organs: Secondary | ICD-10-CM

## 2023-11-27 DIAGNOSIS — N2889 Other specified disorders of kidney and ureter: Secondary | ICD-10-CM | POA: Diagnosis present

## 2023-11-27 DIAGNOSIS — Z905 Acquired absence of kidney: Secondary | ICD-10-CM

## 2023-11-27 DIAGNOSIS — Z880 Allergy status to penicillin: Secondary | ICD-10-CM

## 2023-11-27 DIAGNOSIS — K851 Biliary acute pancreatitis without necrosis or infection: Principal | ICD-10-CM | POA: Diagnosis present

## 2023-11-27 DIAGNOSIS — K8047 Calculus of bile duct with acute and chronic cholecystitis with obstruction: Secondary | ICD-10-CM | POA: Diagnosis present

## 2023-11-27 DIAGNOSIS — Z803 Family history of malignant neoplasm of breast: Secondary | ICD-10-CM

## 2023-11-27 DIAGNOSIS — C642 Malignant neoplasm of left kidney, except renal pelvis: Secondary | ICD-10-CM | POA: Diagnosis present

## 2023-11-27 DIAGNOSIS — K8013 Calculus of gallbladder with acute and chronic cholecystitis with obstruction: Secondary | ICD-10-CM | POA: Diagnosis present

## 2023-11-27 DIAGNOSIS — K7460 Unspecified cirrhosis of liver: Secondary | ICD-10-CM | POA: Diagnosis present

## 2023-11-27 DIAGNOSIS — Z603 Acculturation difficulty: Secondary | ICD-10-CM | POA: Diagnosis present

## 2023-11-27 LAB — URINALYSIS, ROUTINE W REFLEX MICROSCOPIC
Bilirubin Urine: NEGATIVE
Glucose, UA: NEGATIVE mg/dL
Ketones, ur: NEGATIVE mg/dL
Nitrite: NEGATIVE
Protein, ur: NEGATIVE mg/dL
Specific Gravity, Urine: 1.006 (ref 1.005–1.030)
pH: 6 (ref 5.0–8.0)

## 2023-11-27 LAB — COMPREHENSIVE METABOLIC PANEL WITH GFR
ALT: 172 U/L — ABNORMAL HIGH (ref 0–44)
AST: 170 U/L — ABNORMAL HIGH (ref 15–41)
Albumin: 4 g/dL (ref 3.5–5.0)
Alkaline Phosphatase: 167 U/L — ABNORMAL HIGH (ref 38–126)
Anion gap: 6 (ref 5–15)
BUN: 10 mg/dL (ref 6–20)
CO2: 27 mmol/L (ref 22–32)
Calcium: 9 mg/dL (ref 8.9–10.3)
Chloride: 103 mmol/L (ref 98–111)
Creatinine, Ser: 0.67 mg/dL (ref 0.44–1.00)
GFR, Estimated: >60 mL/min (ref >60)
Glucose, Bld: 103 mg/dL — ABNORMAL HIGH (ref 70–99)
Potassium: 3.7 mmol/L (ref 3.5–5.1)
Sodium: 136 mmol/L (ref 135–145)
Total Bilirubin: 0.9 mg/dL (ref 0.0–1.2)
Total Protein: 7.3 g/dL (ref 6.5–8.1)

## 2023-11-27 LAB — CBC WITH DIFFERENTIAL/PLATELET
Abs Immature Granulocytes: 0.05 K/uL (ref 0.00–0.07)
Basophils Absolute: 0 K/uL (ref 0.0–0.1)
Basophils Relative: 0 %
Eosinophils Absolute: 0.1 K/uL (ref 0.0–0.5)
Eosinophils Relative: 1 %
HCT: 40.5 % (ref 36.0–46.0)
Hemoglobin: 12.9 g/dL (ref 12.0–15.0)
Immature Granulocytes: 1 %
Lymphocytes Relative: 13 %
Lymphs Abs: 1.3 K/uL (ref 0.7–4.0)
MCH: 31.5 pg (ref 26.0–34.0)
MCHC: 31.9 g/dL (ref 30.0–36.0)
MCV: 98.8 fL (ref 80.0–100.0)
Monocytes Absolute: 0.9 K/uL (ref 0.1–1.0)
Monocytes Relative: 9 %
Neutro Abs: 7.6 K/uL (ref 1.7–7.7)
Neutrophils Relative %: 76 %
Platelets: 280 K/uL (ref 150–400)
RBC: 4.1 MIL/uL (ref 3.87–5.11)
RDW: 11.9 % (ref 11.5–15.5)
WBC: 9.9 K/uL (ref 4.0–10.5)
nRBC: 0 % (ref 0.0–0.2)

## 2023-11-27 LAB — LIPID PANEL
Cholesterol: 171 mg/dL (ref 0–200)
HDL: 61 mg/dL (ref >40)
LDL Cholesterol: 97 mg/dL (ref 0–99)
Total CHOL/HDL Ratio: 2.8 ratio
Triglycerides: 64 mg/dL (ref <150)
VLDL: 13 mg/dL (ref 0–40)

## 2023-11-27 LAB — LIPASE, BLOOD: Lipase: >2800 U/L — ABNORMAL HIGH (ref 11–51)

## 2023-11-27 LAB — HCG, SERUM, QUALITATIVE: Preg, Serum: NEGATIVE

## 2023-11-27 MED ORDER — SODIUM CHLORIDE 0.9 % IV SOLN: INTRAVENOUS | Status: AC

## 2023-11-27 MED ORDER — ENOXAPARIN SODIUM 40 MG/0.4ML IJ SOSY
40.0000 mg | PREFILLED_SYRINGE | INTRAMUSCULAR | Status: DC
  Administered 2023-11-27 – 2023-11-28 (×2): 40 mg via SUBCUTANEOUS
  Filled 2023-11-27 (×2): qty 0.4

## 2023-11-27 MED ORDER — LIDOCAINE VISCOUS HCL 2 % MT SOLN
15.0000 mL | Freq: Once | OROMUCOSAL | Status: DC
  Filled 2023-11-27: qty 15

## 2023-11-27 MED ORDER — FAMOTIDINE IN NACL 20-0.9 MG/50ML-% IV SOLN
20.0000 mg | Freq: Once | INTRAVENOUS | Status: DC
  Administered 2023-11-27: 20 mg via INTRAVENOUS
  Filled 2023-11-27: qty 50

## 2023-11-27 MED ORDER — GADOBUTROL 1 MMOL/ML IV SOLN
5.0000 mL | Freq: Once | INTRAVENOUS | Status: AC | PRN
  Administered 2023-11-27: 5 mL via INTRAVENOUS

## 2023-11-27 MED ORDER — HYDROMORPHONE HCL 1 MG/ML IJ SOLN
1.0000 mg | INTRAMUSCULAR | Status: DC | PRN
  Administered 2023-11-27 – 2023-11-28 (×3): 1 mg via INTRAVENOUS
  Filled 2023-11-27 (×3): qty 1

## 2023-11-27 MED ORDER — HYDROMORPHONE HCL 1 MG/ML IJ SOLN
0.5000 mg | Freq: Once | INTRAMUSCULAR | Status: AC
  Administered 2023-11-27: 0.5 mg via INTRAVENOUS
  Filled 2023-11-27: qty 1

## 2023-11-27 MED ORDER — ONDANSETRON HCL 4 MG PO TABS: 4.0000 mg | ORAL_TABLET | Freq: Four times a day (QID) | ORAL | Status: DC | PRN

## 2023-11-27 MED ORDER — ACETAMINOPHEN 650 MG RE SUPP
650.0000 mg | Freq: Four times a day (QID) | RECTAL | Status: DC | PRN
Start: 2023-11-27 — End: 2023-11-29

## 2023-11-27 MED ORDER — METOCLOPRAMIDE HCL 5 MG/ML IJ SOLN
10.0000 mg | Freq: Once | INTRAMUSCULAR | Status: AC
  Administered 2023-11-27: 10 mg via INTRAVENOUS
  Filled 2023-11-27: qty 2

## 2023-11-27 MED ORDER — ONDANSETRON HCL 4 MG/2ML IJ SOLN
4.0000 mg | Freq: Four times a day (QID) | INTRAMUSCULAR | Status: DC | PRN
  Administered 2023-11-28 – 2023-11-29 (×2): 4 mg via INTRAVENOUS
  Filled 2023-11-27 (×2): qty 2

## 2023-11-27 MED ORDER — SODIUM CHLORIDE 0.9 % IV BOLUS
1000.0000 mL | Freq: Once | INTRAVENOUS | Status: AC
  Administered 2023-11-27: 1000 mL via INTRAVENOUS

## 2023-11-27 MED ORDER — ALUM & MAG HYDROXIDE-SIMETH 200-200-20 MG/5ML PO SUSP
30.0000 mL | Freq: Once | ORAL | Status: DC
  Filled 2023-11-27: qty 30

## 2023-11-27 MED ORDER — IOHEXOL 300 MG/ML  SOLN
100.0000 mL | Freq: Once | INTRAMUSCULAR | Status: AC | PRN
  Administered 2023-11-27: 100 mL via INTRAVENOUS

## 2023-11-27 MED ORDER — PANTOPRAZOLE SODIUM 40 MG IV SOLR
40.0000 mg | INTRAVENOUS | Status: DC
  Administered 2023-11-27 – 2023-11-29 (×3): 40 mg via INTRAVENOUS
  Filled 2023-11-27 (×3): qty 10

## 2023-11-27 MED ORDER — LACTATED RINGERS IV SOLN: INTRAVENOUS | Status: AC

## 2023-11-27 MED ORDER — ACETAMINOPHEN 325 MG PO TABS
650.0000 mg | ORAL_TABLET | Freq: Four times a day (QID) | ORAL | Status: DC | PRN
Start: 2023-11-27 — End: 2023-11-29
  Administered 2023-11-27 – 2023-11-28 (×2): 650 mg via ORAL
  Filled 2023-11-27 (×2): qty 2

## 2023-11-27 MED ORDER — LACTATED RINGERS IV BOLUS
1000.0000 mL | Freq: Once | INTRAVENOUS | Status: AC
  Administered 2023-11-27: 1000 mL via INTRAVENOUS

## 2023-11-27 NOTE — Discharge Instructions (Addendum)
 You have a large mass on your left kidney that has the appearance of cancer.  You will need to call the telephone numbers below to see the urologist in the office to schedule a surgery.  Call Dr. Little office first.  If you are unable to obtain a close follow-up appointment, call the other telephone numbers listed.  You should get seen within the next week.  [facility]  Please arrive at least 30 min before your appointment to complete your check in paperwork.  If you are unable to arrive 30 min prior to your appointment time we may have to cancel or reschedule you. LAPAROSCOPIC SURGERY: POST OP INSTRUCTIONS Always review your discharge instruction sheet given to you by the facility where your surgery was performed. IF YOU HAVE DISABILITY OR FAMILY LEAVE FORMS, YOU MUST BRING THEM TO THE OFFICE FOR PROCESSING.   DO NOT GIVE THEM TO YOUR DOCTOR.  PAIN CONTROL  First take acetaminophen  (Tylenol ) AND/or ibuprofen  (Advil ) to control your pain after surgery.  Follow directions on package.  Taking acetaminophen  (Tylenol ) and/or ibuprofen  (Advil ) regularly after surgery will help to control your pain and lower the amount of prescription pain medication you may need.  You should not take more than 4,000 mg (4 grams) of acetaminophen  (Tylenol ) in 24 hours.  You should not take ibuprofen  (Advil ), aleve, motrin , naprosyn or other NSAIDS if you have a history of stomach ulcers or chronic kidney disease.  A prescription for pain medication may be given to you upon discharge.  Take your pain medication as prescribed, if you still have uncontrolled pain after taking acetaminophen  (Tylenol ) or ibuprofen  (Advil ). Use ice packs to help control pain. If you need a refill on your pain medication, please contact your pharmacy.  They will contact our office to request authorization. Prescriptions will not be filled after 5pm or on week-ends.  HOME MEDICATIONS Take your usually prescribed  medications unless otherwise directed.  DIET You should follow a light diet the first few days after arrival home.  Be sure to include lots of fluids daily. Avoid fatty, fried foods.   CONSTIPATION It is common to experience some constipation after surgery and if you are taking pain medication.  Increasing fluid intake and taking a stool softener (such as Colace) will usually help or prevent this problem from occurring.  A mild laxative (Milk of Magnesia or Miralax) should be taken according to package instructions if there are no bowel movements after 48 hours.  WOUND/INCISION CARE Most patients will experience some swelling and bruising in the area of the incisions.  Ice packs will help.  Swelling and bruising can take several days to resolve.  Unless discharge instructions indicate otherwise, follow guidelines below  STERI-STRIPS - you may remove your outer bandages 48 hours after surgery, and you may shower at that time.  You have steri-strips (small skin tapes) in place directly over the incision.  These strips should be left on the skin for 7-10 days.   DERMABOND/SKIN GLUE - you may shower in 24 hours.  The glue will flake off over the next 2-3 weeks. Any sutures or staples will be removed at the office during your follow-up visit.  ACTIVITIES You may resume regular (light) daily activities beginning the next day--such as daily self-care, walking, climbing stairs--gradually increasing activities as tolerated.  You may have sexual intercourse when it is comfortable.  Refrain from any heavy lifting or straining until approved by your doctor. You may drive when  you are no longer taking prescription pain medication, you can comfortably wear a seatbelt, and you can safely maneuver your car and apply brakes.  FOLLOW-UP You should see your doctor in the office for a follow-up appointment approximately 2-3 weeks after your surgery.  You should have been given your post-op/follow-up appointment when  your surgery was scheduled.  If you did not receive a post-op/follow-up appointment, make sure that you call for this appointment within a day or two after you arrive home to insure a convenient appointment time.   WHEN TO CALL YOUR DOCTOR: Fever over 101.0 Inability to urinate Continued bleeding from incision. Increased pain, redness, or drainage from the incision. Increasing abdominal pain  The clinic staff is available to answer your questions during regular business hours.  Please don't hesitate to call and ask to speak to one of the nurses for clinical concerns.  If you have a medical emergency, go to the nearest emergency room or call 911.  A surgeon from [facility] is always on call at the hospital. [address], [place], [place]  [address] ? [address] [address], [place], [place]   [address] [phone] ? [phone] ? FAX [phone]   Urology Contact Info: [facility]- Dr. Francisca [phone]  [facility] [place]- Dr. Alma [phone]  [facility] [address] second floor [place], [place]  [address] [phone]   [facility]

## 2023-11-27 NOTE — ED Notes (Signed)
 Attempted to call report will attempt to call report later.

## 2023-11-27 NOTE — ED Triage Notes (Signed)
 Patient c/o upper abdominal pain that she feels in her back. Pain since 8th of July the pain comes and goes. Denies n/v/d

## 2023-11-27 NOTE — Consult Note (Signed)
 Urology Consult Note   Requesting Attending Physician:  Melvenia Motto, MD Service Providing Consult: Urology  Consulting Attending: Dr. Carolee   Reason for Consult:  renal mass favoring RCC  HPI: Donna Trevino is seen in consultation for reasons noted above at the request of Melvenia Motto, MD. patient is a 43 year old female presenting to [facility] emergency department with complaints of intermittent epigastric pain with radiation to the back and nausea x 3 months.  She notes pain to be 8/10.  CT A/P and MRI noted large 8.0 x 6.5 x 7.0 left renal mass with no evidence of metastasis or renal vein involvement.  In addition there was a contracted gallbladder for which she is undergoing MRCP.  On my arrival patient was alert, oriented, and in good spirits.  She was accompanied by her 2 daughters whom spoke [place] though she required an interpreter, who joined us  by video.  Case and plan was reviewed with her and her daughters.  All questions were answered to their satisfaction. ------------------  Assessment:   43 y.o. female with new finding of large left renal mass   Recommendations: # Large left renal mass, presumably RCC  Preserved renal function Urinalysis unremarkable MRI confirms 8.0 x 6.5 x 7.0 left renal mass without metastasis or renal vein involvement. Discussed that she would ultimately require nephrectomy and that the absence of metastasis boded well prognostically. Referrals placed for Dr. Sherrilee of [place] either Dr. Quilla of [place].  [facility] contact information was placed in discharge instructions in the event that patient cannot get through for scheduling. Urology will follow peripherally.  Please call with questions  Case and plan discussed with Dr. Carolee  Past Medical History: Past Medical History:  Diagnosis Date   Anxiety    No pertinent past medical history     Past Surgical History:  Past Surgical History:  Procedure Laterality  Date   BREAST BIOPSY Right 05/23/2021   NO PAST SURGERIES      Medication: Current Facility-Administered Medications  Medication Dose Route Frequency Provider Last Rate Last Admin   HYDROmorphone  (DILAUDID ) injection 1 mg  1 mg Intravenous Q2H PRN Melvenia Motto, MD       lactated ringers  infusion   Intravenous Continuous Melvenia Motto, MD 125 mL/hr at 11/27/23 0955 New Bag at 11/27/23 0955   Current Outpatient Medications  Medication Sig Dispense Refill   alum & mag hydroxide-simeth (MAALOX MAX) 400-400-40 MG/5ML suspension Take 10 mLs by mouth every 6 (six) hours as needed for indigestion. (Patient not taking: Reported on 04/27/2022) 355 mL 0   cholecalciferol (VITAMIN D3) 25 MCG (1000 UNIT) tablet Take 1,000 Units by mouth daily.     ferrous sulfate  325 (65 FE) MG tablet Take by mouth daily with breakfast.     Hyoscyamine  Sulfate SL (LEVSIN /SL) 0.125 MG SUBL Place 1 each under the tongue 4 (four) times daily as needed for up to 5 days. 30 tablet 0   ibuprofen  (ADVIL ) 200 MG tablet Take 200 mg by mouth every 6 (six) hours as needed. 2 tablets (2) times daily oral as needed (Patient not taking: Reported on 04/27/2022)     medroxyPROGESTERone  (DEPO-SUBQ PROVERA  104) 104 MG/0.65ML injection Inject 104 mg into the skin every 3 (three) months. (Patient not taking: Reported on 04/27/2022)     Multiple Vitamin (MULTIVITAMIN) tablet Take 1 tablet by mouth daily. (Patient not taking: Reported on 04/27/2022)     vitamin B-12 (CYANOCOBALAMIN) 50 MCG tablet Take 50 mcg by mouth daily.  Allergies: Allergies  Allergen Reactions   Penicillins     Dizziness, headache, nausea    Social History: Social History   Tobacco Use   Smoking status: Never   Smokeless tobacco: Never  Vaping Use   Vaping status: Never Used  Substance Use Topics   Alcohol use: No   Drug use: No    Family History Family History  Problem Relation Age of Onset   Stomach cancer Father    Breast cancer Maternal Aunt     Breast cancer Maternal Aunt    Anesthesia problems Neg Hx     Review of Systems  Genitourinary:  Positive for flank pain. Negative for dysuria, frequency, hematuria and urgency.     Objective   Vital signs in last 24 hours: BP (!) 151/94   Pulse 90   Temp 97.9 F (36.6 C)   Resp 18   SpO2 100%   Physical Exam General: A&O, resting, appropriate HEENT: [place]/AT Pulmonary: Normal work of breathing Cardiovascular: no cyanosis Neuro: Appropriate, no focal neurological deficits  Most Recent Labs: Lab Results  Component Value Date   WBC 9.9 11/27/2023   HGB 12.9 11/27/2023   HCT 40.5 11/27/2023   PLT 280 11/27/2023    Lab Results  Component Value Date   NA 136 11/27/2023   K 3.7 11/27/2023   CL 103 11/27/2023   CO2 27 11/27/2023   BUN 10 11/27/2023   CREATININE 0.67 11/27/2023   CALCIUM 9.0 11/27/2023    No results found for: INR, APTT   Urine Culture: @[id number] (laburin,org,[id number],[id number])@   IMAGING: MR ABDOMEN MRCP W WO CONTAST Result Date: 11/27/2023 CLINICAL DATA:  Pancreatitis. Gallstones. Evaluate for choledocholithiasis. EXAM: MRI ABDOMEN WITHOUT AND WITH CONTRAST (INCLUDING MRCP) TECHNIQUE: Multiplanar multisequence MR imaging of the abdomen was performed both before and after the administration of intravenous contrast. Heavily T2-weighted images of the biliary and pancreatic ducts were obtained, and three-dimensional MRCP images were rendered by post processing. CONTRAST:  5mL GADAVIST  GADOBUTROL  1 MMOL/ML IV SOLN COMPARISON:  Earlier today FINDINGS: Lower chest: No acute findings. Hepatobiliary: No focal no suspicious liver abnormality. Scratch no suspicious liver abnormality. Gallbladder is contracted containing multiple stones. There is no pericholecystic inflammation identified. The common bile duct is dilated measuring up to 1.4 cm. Mild intrahepatic bile duct dilatation. Numerous tiny stones fill the mid and distal common bile duct as well as the cystic  duct. The largest common bile duct stone measures 4 mm. The remaining common bile duct stones measure 2-3 mm. Pancreas: No mass, inflammatory changes, or other parenchymal abnormality identified. Spleen:  Within normal limits in size and appearance. Adrenals/Urinary Tract:  Normal adrenal glands. Arising off the anterior upper pole of left kidney is a mixed solid and cystic mass which measures 8.0 x 6.5 by 7.0 cm, image 8/4 and image 14/3. This shows some areas of intrinsic increased T1 signal compatible with hemorrhagic component. The solid components show avid enhancement following the IV administration of contrast material. There is no signs of renal vein invasion. Right kidney appears normal. Stomach/Bowel: Visualized portions within the abdomen are unremarkable. Vascular/Lymphatic: No pathologically enlarged lymph nodes identified. No abdominal aortic aneurysm demonstrated. Other:  No ascites or focal fluid collections. Musculoskeletal: No suspicious bone lesions identified. IMPRESSION: 1. Cholelithiasis without signs of acute cholecystitis. 2. Numerous tiny stones fill the mid and distal common bile duct as well as the cystic duct. The largest common bile duct stone measures 4 mm. The remaining common bile duct stones measure 2-3 mm.  3. Mixed solid and cystic mass arising off the anterior upper pole of left kidney measures 8.0 x 6.5 x 7.0 cm. This is compatible with renal cell carcinoma. No signs of renal vein invasion or metastatic disease. Electronically Signed   By: Waddell Calk M.D.   On: 11/27/2023 11:56   MR 3D Recon At Scanner Result Date: 11/27/2023 CLINICAL DATA:  Pancreatitis. Gallstones. Evaluate for choledocholithiasis. EXAM: MRI ABDOMEN WITHOUT AND WITH CONTRAST (INCLUDING MRCP) TECHNIQUE: Multiplanar multisequence MR imaging of the abdomen was performed both before and after the administration of intravenous contrast. Heavily T2-weighted images of the biliary and pancreatic ducts were  obtained, and three-dimensional MRCP images were rendered by post processing. CONTRAST:  5mL GADAVIST  GADOBUTROL  1 MMOL/ML IV SOLN COMPARISON:  Earlier today FINDINGS: Lower chest: No acute findings. Hepatobiliary: No focal no suspicious liver abnormality. Scratch no suspicious liver abnormality. Gallbladder is contracted containing multiple stones. There is no pericholecystic inflammation identified. The common bile duct is dilated measuring up to 1.4 cm. Mild intrahepatic bile duct dilatation. Numerous tiny stones fill the mid and distal common bile duct as well as the cystic duct. The largest common bile duct stone measures 4 mm. The remaining common bile duct stones measure 2-3 mm. Pancreas: No mass, inflammatory changes, or other parenchymal abnormality identified. Spleen:  Within normal limits in size and appearance. Adrenals/Urinary Tract:  Normal adrenal glands. Arising off the anterior upper pole of left kidney is a mixed solid and cystic mass which measures 8.0 x 6.5 by 7.0 cm, image 8/4 and image 14/3. This shows some areas of intrinsic increased T1 signal compatible with hemorrhagic component. The solid components show avid enhancement following the IV administration of contrast material. There is no signs of renal vein invasion. Right kidney appears normal. Stomach/Bowel: Visualized portions within the abdomen are unremarkable. Vascular/Lymphatic: No pathologically enlarged lymph nodes identified. No abdominal aortic aneurysm demonstrated. Other:  No ascites or focal fluid collections. Musculoskeletal: No suspicious bone lesions identified. IMPRESSION: 1. Cholelithiasis without signs of acute cholecystitis. 2. Numerous tiny stones fill the mid and distal common bile duct as well as the cystic duct. The largest common bile duct stone measures 4 mm. The remaining common bile duct stones measure 2-3 mm. 3. Mixed solid and cystic mass arising off the anterior upper pole of left kidney measures 8.0 x 6.5 x  7.0 cm. This is compatible with renal cell carcinoma. No signs of renal vein invasion or metastatic disease. Electronically Signed   By: Waddell Calk M.D.   On: 11/27/2023 11:56   CT ABDOMEN PELVIS W CONTRAST Result Date: 11/27/2023 CLINICAL DATA:  Abdominal pain radiating to the back for 1 week. * Tracking Code: BO * EXAM: CT ABDOMEN AND PELVIS WITH CONTRAST TECHNIQUE: Multidetector CT imaging of the abdomen and pelvis was performed using the standard protocol following bolus administration of intravenous contrast. RADIATION DOSE REDUCTION: This exam was performed according to the departmental dose-optimization program which includes automated exposure control, adjustment of the mA and/or kV according to patient size and/or use of iterative reconstruction technique. CONTRAST:  OMNIPAQUE  IOHEXOL  300 MG/ML  SOLN COMPARISON:  Today's ultrasound of the right upper quadrant. CT of 01/25/2006 is also reviewed. FINDINGS: Lower chest: Clear lung bases. Normal heart size without pericardial or pleural effusion. Hepatobiliary: Normal liver. The gallbladder is contracted with apparent wall thickening including on 32/2. No calcified stones. Mild intrahepatic biliary duct dilatation. Moderate common duct dilatation, including at 12 mm on coronal reformats. No calcified common duct  stone. Pancreas: Normal, without mass or ductal dilatation. Spleen: Normal in size, without focal abnormality. Adrenals/Urinary Tract: Normal adrenal glands.  Normal right kidney. Off the anterior upper pole left kidney is a mixed solid and cystic mass which measures 6.8 x 7.6 by 6.5 cm. Normal urinary bladder. Stomach/Bowel: Normal stomach, without wall thickening. Normal colon, appendix, and terminal ileum. Normal small bowel. Vascular/Lymphatic: Aortic atherosclerosis. Patent renal veins. No abdominopelvic adenopathy. Reproductive: Normal uterus and adnexa.  Prominent gonadal veins. Other: Trace free pelvic fluid is likely physiologic.  No free intraperitoneal air. Musculoskeletal: No acute osseous abnormality. IMPRESSION: 1. 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 No renal vein involvement or metastatic disease. 2. Biliary duct dilatation without calcified obstructive stone. Correlate with bilirubin levels and consider MRCP. 3. Contracted gallbladder. Apparent wall thickening could be secondary or represent noncalcified gallstones versus adenomyomatosis. This would also be better evaluated on MRCP. 4. Prominent gonadal veins as can be seen with pelvic congestion syndrome 5.  Aortic Atherosclerosis (ICD10-I70.0).  This is age advanced. Case discussed with Dr. Melvenia at 10:28 a.m. Electronically Signed   By: Rockey Kilts M.D.   On: 11/27/2023 10:29   US  Abdomen Limited Result Date: 11/27/2023 CLINICAL DATA:  Right upper quadrant pain EXAM: ULTRASOUND ABDOMEN LIMITED RIGHT UPPER QUADRANT COMPARISON:  CT 01/25/2006 FINDINGS: Gallbladder: The gallbladder is incompletely distended and poorly evaluated. Moderate wall thickening at 7 mm. Possible shadowing stone. Sonographic Beverley sign could not be evaluated as patient was pre-medicated. Common bile duct: Diameter: Moderately dilated at 1.7 cm. Probable common duct stone of 12 mm including on 20/6. Liver: Suspicion of mildly irregular hepatic contour. Portal vein is patent on color Doppler imaging with normal direction of blood flow towards the liver. Other: None. IMPRESSION: Biliary duct dilatation with probable choledocholithiasis. MRCP could confirm. Gallbladder poorly evaluated. Possible cholelithiasis. Nonspecific gallbladder wall thickening. Subtle irregular hepatic capsule for which mild cirrhosis cannot be excluded. This could be of added better evaluated on MRCP. Electronically Signed   By: Rockey Kilts M.D.   On: 11/27/2023 08:44    ------  Ole Bourdon, NP Pager: [phone]   Please contact the urology consult pager with any further  questions/concerns.

## 2023-11-27 NOTE — H&P (Signed)
 History and Physical    Patient: Donna Trevino FMW:[id number] DOB: August 25, 1980 DOA: 11/27/2023 DOS: the patient was seen and examined on 11/27/2023 PCP: Patient, No Pcp Per  Patient coming from: Home  Chief Complaint:  Chief Complaint  Patient presents with   Abdominal Pain   HPI: Jeraldin Fesler is a 43 y.o. female with medical history significant of anxiety who presented to the emergency department complaints of abdominal pain for a week that worsens with food intake.  The pain is colicky in nature and radiates to her back. She was seeing in the outpatient setting this past weekend given hyoscyamine  125 mcg tablets every 8 hours as needed without significant results. No history of EtOH use.  No nausea, emesis, diarrhea, constipation, melena or hematochezia. No flank pain, dysuria, frequency or hematuria.  She denied fever, chills, rhinorrhea, sore throat, wheezing or hemoptysis. No chest pain, palpitations, diaphoresis, PND, orthopnea or pitting edema of the lower extremities.No polyuria, polydipsia, polyphagia or blurred vision.   Lab work: Urinalysis shows small hemoglobin, trace leukocyte esterase and rare bacteria.  Serum pregnancy test was negative.  CBC was normal.  Cholesterol panel was unremarkable.  Lipase was greater than 2800 units/L.  CMP showed normal electrolytes, normal renal function, normal bilirubin and normal protein levels.  Glucose was 103 mg/dL, AST 129, ALT 827 and alkaline phosphatase 167 units/L.  Imaging: Abdominal ultrasound showed biliary duct dilatation with probable choledocholithiasis MRCP recommended.  Gallbladder poorly evaluated possible cholelithiasis.  Mild cirrhosis could not be excluded.  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  No renal vein involvement or metastatic disease.  Biliary dilatation without calcified obstructive stone.  MRCP recommended.   Contracted gallbladder apparent wall thickening could be secondary represent noncalcified gallstones versus adenomyomatosis.  Prominent gonadal veins as can be seen with pelvic congestion syndrome.  Advanced aortic atherosclerosis for age.  MRCP showed cholelithiasis without signs of acute cholecystitis.  Numerous tiny stones feel the mid and distal CBD as well as the cystic duct.  The largest common bile duct stone measures 4 mm.  The remaining common bile duct measured 2 to 3 mm.  There is a mixed solid and cystic mass arising off the anterior upper pole of the left kidney measuring 8.0 x 6.5 x 7.0 cm compatible with renal cell carcinoma without evidence of renal vein invasion or metastatic disease.   ED course: Initial vital signs were temperature 98.6 F, pulse 69, respiration 17, BP 126/93 mmHg O2 sat 100% on room air.  Patient received LR 1000 mL liter bolus, metoclopramide  10 mg IVP and was placed on hydromorphone  as needed every 2 hours.  I added normal saline 1000 mL bolus x 1.  Review of Systems: As mentioned in the history of present illness. All other systems reviewed and are negative. Past Medical History:  Diagnosis Date   Anxiety    No pertinent past medical history    Past Surgical History:  Procedure Laterality Date   BREAST BIOPSY Right 05/23/2021   NO PAST SURGERIES     Social History:  reports that she has never smoked. She has never used smokeless tobacco. She reports that she does not drink alcohol and does not use drugs.  Allergies  Allergen Reactions   Penicillins     Dizziness, headache, nausea    Family History  Problem Relation Age of Onset   Stomach cancer Father    Breast cancer Maternal Aunt  Breast cancer Maternal Aunt    Anesthesia problems Neg Hx     Prior to Admission medications   Medication Sig Start Date End Date Taking? Authorizing Provider  alum & mag hydroxide-simeth (MAALOX MAX) 400-400-40 MG/5ML suspension Take 10 mLs by mouth every 6 (six)  hours as needed for indigestion. Patient not taking: Reported on 04/27/2022 02/28/21   Trine Raynell Moder, MD  cholecalciferol (VITAMIN D3) 25 MCG (1000 UNIT) tablet Take 1,000 Units by mouth daily.    [provider]  ferrous sulfate  325 (65 FE) MG tablet Take by mouth daily with breakfast.    [provider]  Hyoscyamine  Sulfate SL (LEVSIN /SL) 0.125 MG SUBL Place 1 each under the tongue 4 (four) times daily as needed for up to 5 days. 02/28/21 03/05/21  Trine Raynell Moder, MD  ibuprofen  (ADVIL ) 200 MG tablet Take 200 mg by mouth every 6 (six) hours as needed. 2 tablets (2) times daily oral as needed Patient not taking: Reported on 04/27/2022    [provider]  medroxyPROGESTERone  (DEPO-SUBQ PROVERA  104) 104 MG/0.65ML injection Inject 104 mg into the skin every 3 (three) months. Patient not taking: Reported on 04/27/2022    [provider]  Multiple Vitamin (MULTIVITAMIN) tablet Take 1 tablet by mouth daily. Patient not taking: Reported on 04/27/2022    [provider]  vitamin B-12 (CYANOCOBALAMIN) 50 MCG tablet Take 50 mcg by mouth daily.    [provider]    Physical Exam: Vitals:   11/27/23 0528 11/27/23 0954 11/27/23 1318  BP: (!) 126/93 (!) 151/94 (!) 139/90  Pulse: 69 90 76  Resp: 17 18 20   Temp: 98.6 F (37 C) 97.9 F (36.6 C) 97.9 F (36.6 C)  TempSrc:   Oral  SpO2: 100% 100% 100%   Physical Exam Vitals and nursing note reviewed.  Constitutional:      Appearance: She is ill-appearing.  HENT:     Head: Normocephalic.     Nose: No rhinorrhea.     Mouth/Throat:     Mouth: Mucous membranes are dry.  Eyes:     General: No scleral icterus.    Pupils: Pupils are equal, round, and reactive to light.  Neck:     Vascular: No JVD.  Cardiovascular:     Rate and Rhythm: Normal rate and regular rhythm.  Pulmonary:     Effort: Pulmonary effort is normal.     Breath sounds: Normal breath sounds. No wheezing or  rales.  Abdominal:     General: Bowel sounds are normal.     Palpations: Abdomen is soft.     Tenderness: There is abdominal tenderness in the right upper quadrant and epigastric area. There is right CVA tenderness. There is no left CVA tenderness, guarding or rebound.  Musculoskeletal:     Cervical back: Neck supple.     Right lower leg: No edema.     Left lower leg: No edema.  Skin:    General: Skin is warm and dry.  Neurological:     General: No focal deficit present.     Mental Status: She is alert and oriented to person, place, and time.  Psychiatric:        Mood and Affect: Mood normal.        Behavior: Behavior normal.     Data Reviewed:  Results are pending, will review when available.  Assessment and Plan: Principal Problem:   Acute pancreatitis Observation/MedSurg. Continue IV fluids. Clear liquid diet. Analgesics as needed. Antiemetics as  needed. Pantoprazole  40 mg IVP daily. Follow CBC, CMP and lipase in AM. GI consult appreciated. -ERCP once pancreatitis cools off. -- They also recommended possible cholecystectomy.  Active Problems:   Renal mass Seen by urology. -Referrals placed for Dr. Sherrilee or Dr. Francisca.    Abnormal LFTs In the setting of choledocholithiasis. Will follow daily with morning labs.    Advance Care Planning:   Code Status: Full Code   Consults: [facility] Marilu Sol, MD) and urology Gwendel Edison, MD).  Family Communication:   Severity of Illness: The appropriate patient status for this patient is INPATIENT. Inpatient status is judged to be reasonable and necessary in order to provide the required intensity of service to ensure the patient's safety. The patient's presenting symptoms, physical exam findings, and initial radiographic and laboratory data in the context of their chronic comorbidities is felt to place them at high risk for further clinical deterioration. Furthermore, it is not anticipated that the patient will be  medically stable for discharge from the hospital within 2 midnights of admission.   * I certify that at the point of admission it is my clinical judgment that the patient will require inpatient hospital care spanning beyond 2 midnights from the point of admission due to high intensity of service, high risk for further deterioration and high frequency of surveillance required.*  Author: Alm Dorn Castor, MD 11/27/2023 2:22 PM  For on call review [email or web address].   This document was prepared using Dragon voice recognition software and may contain some unintended transcription errors.

## 2023-11-27 NOTE — ED Notes (Signed)
 ED TO INPATIENT HANDOFF REPORT  Name/Age/Gender Donna Trevino 43 y.o. female  Code Status Code Status History     Date Active Date Inactive Code Status Order ID Comments User Context   06/16/2011 0011 06/17/2011 1821 Full Code [id number]  Joane Nereida Mons, RN Inpatient       Home/SNF/Other Home  Chief Complaint Acute pancreatitis [K85.90]  Level of Care/Admitting Diagnosis ED Disposition     ED Disposition  Admit   Condition  --   Comment  Hospital Area: [facility] [[id number]]  Level of Care: Med-Surg [16]  May place patient in observation at [facility] or [facility] if equivalent level of care is available:: No  Covid Evaluation: Asymptomatic - no recent exposure (last 10 days) testing not required  Diagnosis: Acute pancreatitis [577.0.ICD-9-CM]  Admitting Physician: CELINDA ALM LOT [[id number]]  Attending Physician: CELINDA ALM LOT [[id number]]  For patients discharging to extended facilities (i.e. SNF, AL, group homes or LTAC) initiate:: Discharge to SNF/Facility Placement COVID-19 Lab Testing Protocol          Medical History Past Medical History:  Diagnosis Date   Anxiety    No pertinent past medical history     Allergies Allergies  Allergen Reactions   Penicillins Nausea And Vomiting and Other (See Comments)    Dizziness, headache, nausea    IV Location/Drains/Wounds Patient Lines/Drains/Airways Status     Active Line/Drains/Airways     Name Placement date Placement time Site Days   Peripheral IV 11/27/23 20 G Left Antecubital 11/27/23  --  Antecubital  less than 1            Labs/Imaging Results for orders placed or performed during the hospital encounter of 11/27/23 (from the past 48 hours)  Comprehensive metabolic panel     Status: Abnormal   Collection Time: 11/27/23  6:25 AM  Result Value Ref Range   Sodium 136 135 - 145 mmol/L   Potassium 3.7 3.5 - 5.1 mmol/L   Chloride 103 98 - 111 mmol/L   CO2 27  22 - 32 mmol/L   Glucose, Bld 103 (H) 70 - 99 mg/dL    Comment: Glucose reference range applies only to samples taken after fasting for at least 8 hours.   BUN 10 6 - 20 mg/dL   Creatinine, Ser 9.32 0.44 - 1.00 mg/dL   Calcium 9.0 8.9 - 89.6 mg/dL   Total Protein 7.3 6.5 - 8.1 g/dL   Albumin 4.0 3.5 - 5.0 g/dL   AST 829 (H) 15 - 41 U/L   ALT 172 (H) 0 - 44 U/L   Alkaline Phosphatase 167 (H) 38 - 126 U/L   Total Bilirubin 0.9 0.0 - 1.2 mg/dL   GFR, Estimated >39 >39 mL/min    Comment: (NOTE) Calculated using the CKD-EPI Creatinine Equation (2021)    Anion gap 6 5 - 15    Comment: Performed at [facility], [address] [place], [place] [address]  Lipase, blood     Status: Abnormal   Collection Time: 11/27/23  6:25 AM  Result Value Ref Range   Lipase >2,800 (H) 11 - 51 U/L    Comment: RESULT CONFIRMED BY MANUAL DILUTION Performed at [facility], [address] [place], [place] [address]   CBC with Diff     Status: None   Collection Time: 11/27/23  6:25 AM  Result Value Ref Range   WBC 9.9 4.0 - 10.5 K/uL   RBC 4.10 3.87 -  5.11 MIL/uL   Hemoglobin 12.9 12.0 - 15.0 g/dL   HCT 59.4 63.9 - 53.9 %   MCV 98.8 80.0 - 100.0 fL   MCH 31.5 26.0 - 34.0 pg   MCHC 31.9 30.0 - 36.0 g/dL   RDW 88.0 88.4 - 84.4 %   Platelets 280 150 - 400 K/uL   nRBC 0.0 0.0 - 0.2 %   Neutrophils Relative % 76 %   Neutro Abs 7.6 1.7 - 7.7 K/uL   Lymphocytes Relative 13 %   Lymphs Abs 1.3 0.7 - 4.0 K/uL   Monocytes Relative 9 %   Monocytes Absolute 0.9 0.1 - 1.0 K/uL   Eosinophils Relative 1 %   Eosinophils Absolute 0.1 0.0 - 0.5 K/uL   Basophils Relative 0 %   Basophils Absolute 0.0 0.0 - 0.1 K/uL   Immature Granulocytes 1 %   Abs Immature Granulocytes 0.05 0.00 - 0.07 K/uL    Comment: Performed at [facility], [address], [place], [place] [address]  hCG, serum, qualitative     Status: None   Collection Time: 11/27/23  6:25 AM   Result Value Ref Range   Preg, Serum NEGATIVE NEGATIVE    Comment:        THE SENSITIVITY OF THIS METHODOLOGY IS >10 mIU/mL. Performed at [facility], [address] [place], [place] [address]   Lipid panel     Status: None   Collection Time: 11/27/23  6:34 AM  Result Value Ref Range   Cholesterol 171 0 - 200 mg/dL   Triglycerides 64 <849 mg/dL   HDL 61 >59 mg/dL   Total CHOL/HDL Ratio 2.8 RATIO   VLDL 13 0 - 40 mg/dL   LDL Cholesterol 97 0 - 99 mg/dL    Comment:        Total Cholesterol/HDL:CHD Risk Coronary Heart Disease Risk Table                     Men   Women  1/2 Average Risk   3.4   3.3  Average Risk       5.0   4.4  2 X Average Risk   9.6   7.1  3 X Average Risk  23.4   11.0        Use the calculated Patient Ratio above and the CHD Risk Table to determine the patient's CHD Risk.        ATP III CLASSIFICATION (LDL):  <100     mg/dL   Optimal  899-870  mg/dL   Near or Above                    Optimal  130-159  mg/dL   Borderline  839-810  mg/dL   High  >809     mg/dL   Very High Performed at [facility], [address] [place], [place] [address]   Urinalysis, Routine w reflex microscopic -Urine, Clean Catch     Status: Abnormal   Collection Time: 11/27/23  6:39 AM  Result Value Ref Range   Color, Urine YELLOW YELLOW   APPearance CLEAR CLEAR   Specific Gravity, Urine 1.006 1.005 - 1.030   pH 6.0 5.0 - 8.0   Glucose, UA NEGATIVE NEGATIVE mg/dL   Hgb urine dipstick SMALL (A) NEGATIVE   Bilirubin Urine NEGATIVE NEGATIVE   Ketones, ur NEGATIVE NEGATIVE mg/dL   Protein, ur NEGATIVE NEGATIVE mg/dL   Nitrite NEGATIVE NEGATIVE  Leukocytes,Ua TRACE (A) NEGATIVE   RBC / HPF 0-5 0 - 5 RBC/hpf   WBC, UA 0-5 0 - 5 WBC/hpf   Bacteria, UA RARE (A) NONE SEEN   Squamous Epithelial / HPF 0-5 0 - 5 /HPF   Mucus PRESENT     Comment: Performed at [facility], [address] [place], [place] [address]   MR  ABDOMEN MRCP W WO CONTAST Result Date: 11/27/2023 CLINICAL DATA:  Pancreatitis. Gallstones. Evaluate for choledocholithiasis. EXAM: MRI ABDOMEN WITHOUT AND WITH CONTRAST (INCLUDING MRCP) TECHNIQUE: Multiplanar multisequence MR imaging of the abdomen was performed both before and after the administration of intravenous contrast. Heavily T2-weighted images of the biliary and pancreatic ducts were obtained, and three-dimensional MRCP images were rendered by post processing. CONTRAST:  5mL GADAVIST  GADOBUTROL  1 MMOL/ML IV SOLN COMPARISON:  Earlier today FINDINGS: Lower chest: No acute findings. Hepatobiliary: No focal no suspicious liver abnormality. Scratch no suspicious liver abnormality. Gallbladder is contracted containing multiple stones. There is no pericholecystic inflammation identified. The common bile duct is dilated measuring up to 1.4 cm. Mild intrahepatic bile duct dilatation. Numerous tiny stones fill the mid and distal common bile duct as well as the cystic duct. The largest common bile duct stone measures 4 mm. The remaining common bile duct stones measure 2-3 mm. Pancreas: No mass, inflammatory changes, or other parenchymal abnormality identified. Spleen:  Within normal limits in size and appearance. Adrenals/Urinary Tract:  Normal adrenal glands. Arising off the anterior upper pole of left kidney is a mixed solid and cystic mass which measures 8.0 x 6.5 by 7.0 cm, image 8/4 and image 14/3. This shows some areas of intrinsic increased T1 signal compatible with hemorrhagic component. The solid components show avid enhancement following the IV administration of contrast material. There is no signs of renal vein invasion. Right kidney appears normal. Stomach/Bowel: Visualized portions within the abdomen are unremarkable. Vascular/Lymphatic: No pathologically enlarged lymph nodes identified. No abdominal aortic aneurysm demonstrated. Other:  No ascites or focal fluid collections. Musculoskeletal: No  suspicious bone lesions identified. IMPRESSION: 1. Cholelithiasis without signs of acute cholecystitis. 2. Numerous tiny stones fill the mid and distal common bile duct as well as the cystic duct. The largest common bile duct stone measures 4 mm. The remaining common bile duct stones measure 2-3 mm. 3. Mixed solid and cystic mass arising off the anterior upper pole of left kidney measures 8.0 x 6.5 x 7.0 cm. This is compatible with renal cell carcinoma. No signs of renal vein invasion or metastatic disease. Electronically Signed   By: Waddell Calk M.D.   On: 11/27/2023 11:56   MR 3D Recon At Scanner Result Date: 11/27/2023 CLINICAL DATA:  Pancreatitis. Gallstones. Evaluate for choledocholithiasis. EXAM: MRI ABDOMEN WITHOUT AND WITH CONTRAST (INCLUDING MRCP) TECHNIQUE: Multiplanar multisequence MR imaging of the abdomen was performed both before and after the administration of intravenous contrast. Heavily T2-weighted images of the biliary and pancreatic ducts were obtained, and three-dimensional MRCP images were rendered by post processing. CONTRAST:  5mL GADAVIST  GADOBUTROL  1 MMOL/ML IV SOLN COMPARISON:  Earlier today FINDINGS: Lower chest: No acute findings. Hepatobiliary: No focal no suspicious liver abnormality. Scratch no suspicious liver abnormality. Gallbladder is contracted containing multiple stones. There is no pericholecystic inflammation identified. The common bile duct is dilated measuring up to 1.4 cm. Mild intrahepatic bile duct dilatation. Numerous tiny stones fill the mid and distal common bile duct as well as the cystic duct. The largest common bile duct stone measures 4 mm.  The remaining common bile duct stones measure 2-3 mm. Pancreas: No mass, inflammatory changes, or other parenchymal abnormality identified. Spleen:  Within normal limits in size and appearance. Adrenals/Urinary Tract:  Normal adrenal glands. Arising off the anterior upper pole of left kidney is a mixed solid and cystic  mass which measures 8.0 x 6.5 by 7.0 cm, image 8/4 and image 14/3. This shows some areas of intrinsic increased T1 signal compatible with hemorrhagic component. The solid components show avid enhancement following the IV administration of contrast material. There is no signs of renal vein invasion. Right kidney appears normal. Stomach/Bowel: Visualized portions within the abdomen are unremarkable. Vascular/Lymphatic: No pathologically enlarged lymph nodes identified. No abdominal aortic aneurysm demonstrated. Other:  No ascites or focal fluid collections. Musculoskeletal: No suspicious bone lesions identified. IMPRESSION: 1. Cholelithiasis without signs of acute cholecystitis. 2. Numerous tiny stones fill the mid and distal common bile duct as well as the cystic duct. The largest common bile duct stone measures 4 mm. The remaining common bile duct stones measure 2-3 mm. 3. Mixed solid and cystic mass arising off the anterior upper pole of left kidney measures 8.0 x 6.5 x 7.0 cm. This is compatible with renal cell carcinoma. No signs of renal vein invasion or metastatic disease. Electronically Signed   By: Waddell Calk M.D.   On: 11/27/2023 11:56   CT ABDOMEN PELVIS W CONTRAST Result Date: 11/27/2023 CLINICAL DATA:  Abdominal pain radiating to the back for 1 week. * Tracking Code: BO * EXAM: CT ABDOMEN AND PELVIS WITH CONTRAST TECHNIQUE: Multidetector CT imaging of the abdomen and pelvis was performed using the standard protocol following bolus administration of intravenous contrast. RADIATION DOSE REDUCTION: This exam was performed according to the departmental dose-optimization program which includes automated exposure control, adjustment of the mA and/or kV according to patient size and/or use of iterative reconstruction technique. CONTRAST:  OMNIPAQUE  IOHEXOL  300 MG/ML  SOLN COMPARISON:  Today's ultrasound of the right upper quadrant. CT of 01/25/2006 is also reviewed. FINDINGS: Lower chest: Clear lung  bases. Normal heart size without pericardial or pleural effusion. Hepatobiliary: Normal liver. The gallbladder is contracted with apparent wall thickening including on 32/2. No calcified stones. Mild intrahepatic biliary duct dilatation. Moderate common duct dilatation, including at 12 mm on coronal reformats. No calcified common duct stone. Pancreas: Normal, without mass or ductal dilatation. Spleen: Normal in size, without focal abnormality. Adrenals/Urinary Tract: Normal adrenal glands.  Normal right kidney. Off the anterior upper pole left kidney is a mixed solid and cystic mass which measures 6.8 x 7.6 by 6.5 cm. Normal urinary bladder. Stomach/Bowel: Normal stomach, without wall thickening. Normal colon, appendix, and terminal ileum. Normal small bowel. Vascular/Lymphatic: Aortic atherosclerosis. Patent renal veins. No abdominopelvic adenopathy. Reproductive: Normal uterus and adnexa.  Prominent gonadal veins. Other: Trace free pelvic fluid is likely physiologic. No free intraperitoneal air. Musculoskeletal: No acute osseous abnormality. IMPRESSION: 1. 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 No renal vein involvement or metastatic disease. 2. Biliary duct dilatation without calcified obstructive stone. Correlate with bilirubin levels and consider MRCP. 3. Contracted gallbladder. Apparent wall thickening could be secondary or represent noncalcified gallstones versus adenomyomatosis. This would also be better evaluated on MRCP. 4. Prominent gonadal veins as can be seen with pelvic congestion syndrome 5.  Aortic Atherosclerosis (ICD10-I70.0).  This is age advanced. Case discussed with Dr. Melvenia at 10:28 a.m. Electronically Signed   By: Rockey Kilts M.D.   On: 11/27/2023 10:29  US  Abdomen Limited Result Date: 11/27/2023 CLINICAL DATA:  Right upper quadrant pain EXAM: ULTRASOUND ABDOMEN LIMITED RIGHT UPPER QUADRANT COMPARISON:  CT 01/25/2006 FINDINGS: Gallbladder: The  gallbladder is incompletely distended and poorly evaluated. Moderate wall thickening at 7 mm. Possible shadowing stone. Sonographic Beverley sign could not be evaluated as patient was pre-medicated. Common bile duct: Diameter: Moderately dilated at 1.7 cm. Probable common duct stone of 12 mm including on 20/6. Liver: Suspicion of mildly irregular hepatic contour. Portal vein is patent on color Doppler imaging with normal direction of blood flow towards the liver. Other: None. IMPRESSION: Biliary duct dilatation with probable choledocholithiasis. MRCP could confirm. Gallbladder poorly evaluated. Possible cholelithiasis. Nonspecific gallbladder wall thickening. Subtle irregular hepatic capsule for which mild cirrhosis cannot be excluded. This could be of added better evaluated on MRCP. Electronically Signed   By: Rockey Kilts M.D.   On: 11/27/2023 08:44    Pending Labs Unresulted Labs (From admission, onward)    None       Vitals/Pain Today's Vitals   11/27/23 1318 11/27/23 1330 11/27/23 1400 11/27/23 1430  BP: (!) 139/90 (!) 126/93 (!) 134/92 133/75  Pulse: 76 80 90 87  Resp: 20     Temp: 97.9 F (36.6 C)     TempSrc: Oral     SpO2: 100% 99% 99% 100%  PainSc:        Isolation Precautions No active isolations  Medications Medications  HYDROmorphone  (DILAUDID ) injection 1 mg (has no administration in time range)  lactated ringers  infusion ( Intravenous New Bag/Given 11/27/23 0955)  lactated ringers  bolus 1,000 mL (0 mLs Intravenous Stopped 11/27/23 0908)  metoCLOPramide  (REGLAN ) injection 10 mg (10 mg Intravenous Given 11/27/23 0747)  HYDROmorphone  (DILAUDID ) injection 0.5 mg (0.5 mg Intravenous Given 11/27/23 0747)  iohexol  (OMNIPAQUE ) 300 MG/ML solution 100 mL (100 mLs Intravenous Contrast Given 11/27/23 0936)  gadobutrol  (GADAVIST ) 1 MMOL/ML injection 5 mL (5 mLs Intravenous Contrast Given 11/27/23 1117)    Mobility walks

## 2023-11-27 NOTE — ED Provider Notes (Signed)
 [place] EMERGENCY DEPARTMENT AT [facility] Provider Note   CSN: [id number] Arrival date & time: 11/27/23  9479     Patient presents with: Abdominal Pain   Donna Trevino is a 43 y.o. female.   The history is provided by the patient. The history is limited by a language barrier. A language interpreter was used.  Abdominal Pain Associated symptoms: nausea   Patient presents for abdominal pain.  Medical history includes anxiety.  For the past 3 months, she has had intermittent epigastric pain with radiation to her back.  Symptoms worsen after eating.  She was seen at outpatient clinic over the weekend.  They had plans for her to come back for an ultrasound.  Due to the worsening pain today, patient presents to the ED.  She describes 8/10 severity epigastric pain with associated nausea.  She denies any other associated symptoms.     Prior to Admission medications   Medication Sig Start Date End Date Taking? Authorizing Provider  alum & mag hydroxide-simeth (MAALOX MAX) 400-400-40 MG/5ML suspension Take 10 mLs by mouth every 6 (six) hours as needed for indigestion. Patient not taking: Reported on 04/27/2022 02/28/21   Trine Raynell Moder, MD  cholecalciferol (VITAMIN D3) 25 MCG (1000 UNIT) tablet Take 1,000 Units by mouth daily.    [provider]  ferrous sulfate  325 (65 FE) MG tablet Take by mouth daily with breakfast.    [provider]  Hyoscyamine  Sulfate SL (LEVSIN /SL) 0.125 MG SUBL Place 1 each under the tongue 4 (four) times daily as needed for up to 5 days. 02/28/21 03/05/21  Trine Raynell Moder, MD  ibuprofen  (ADVIL ) 200 MG tablet Take 200 mg by mouth every 6 (six) hours as needed. 2 tablets (2) times daily oral as needed Patient not taking: Reported on 04/27/2022    [provider]  medroxyPROGESTERone  (DEPO-SUBQ PROVERA  104) 104 MG/0.65ML injection Inject 104 mg into the skin every 3 (three) months. Patient not taking:  Reported on 04/27/2022    [provider]  Multiple Vitamin (MULTIVITAMIN) tablet Take 1 tablet by mouth daily. Patient not taking: Reported on 04/27/2022    [provider]  vitamin B-12 (CYANOCOBALAMIN) 50 MCG tablet Take 50 mcg by mouth daily.    [provider]    Allergies: Penicillins    Review of Systems  Gastrointestinal:  Positive for abdominal pain and nausea.  All other systems reviewed and are negative.   Updated Vital Signs BP (!) 139/90   Pulse 76   Temp 97.9 F (36.6 C) (Oral)   Resp 20   SpO2 100%   Physical Exam Vitals and nursing note reviewed.  Constitutional:      General: She is not in acute distress.    Appearance: She is well-developed. She is not ill-appearing, toxic-appearing or diaphoretic.  HENT:     Head: Normocephalic and atraumatic.  Eyes:     Conjunctiva/sclera: Conjunctivae normal.  Cardiovascular:     Rate and Rhythm: Normal rate and regular rhythm.  Pulmonary:     Effort: Pulmonary effort is normal. No respiratory distress.  Abdominal:     Palpations: Abdomen is soft.     Tenderness: There is abdominal tenderness in the epigastric area. There is no guarding or rebound.  Musculoskeletal:        General: No swelling.     Cervical back: Neck supple.  Skin:    General: Skin is warm and dry.     Coloration: Skin is not cyanotic or  jaundiced.  Neurological:     General: No focal deficit present.     Mental Status: She is alert and oriented to person, place, and time.  Psychiatric:        Mood and Affect: Mood normal.        Behavior: Behavior normal.     (all labs ordered are listed, but only abnormal results are displayed) Labs Reviewed  COMPREHENSIVE METABOLIC PANEL WITH GFR - Abnormal; Notable for the following components:      Result Value   Glucose, Bld 103 (*)    AST 170 (*)    ALT 172 (*)    Alkaline Phosphatase 167 (*)    All other components within normal limits  LIPASE, BLOOD - Abnormal;  Notable for the following components:   Lipase >2,800 (*)    All other components within normal limits  URINALYSIS, ROUTINE W REFLEX MICROSCOPIC - Abnormal; Notable for the following components:   Hgb urine dipstick SMALL (*)    Leukocytes,Ua TRACE (*)    Bacteria, UA RARE (*)    All other components within normal limits  CBC WITH DIFFERENTIAL/PLATELET  HCG, SERUM, QUALITATIVE  LIPID PANEL    EKG: None  Radiology: MR ABDOMEN MRCP W WO CONTAST Result Date: 11/27/2023 CLINICAL DATA:  Pancreatitis. Gallstones. Evaluate for choledocholithiasis. EXAM: MRI ABDOMEN WITHOUT AND WITH CONTRAST (INCLUDING MRCP) TECHNIQUE: Multiplanar multisequence MR imaging of the abdomen was performed both before and after the administration of intravenous contrast. Heavily T2-weighted images of the biliary and pancreatic ducts were obtained, and three-dimensional MRCP images were rendered by post processing. CONTRAST:  5mL GADAVIST  GADOBUTROL  1 MMOL/ML IV SOLN COMPARISON:  Earlier today FINDINGS: Lower chest: No acute findings. Hepatobiliary: No focal no suspicious liver abnormality. Scratch no suspicious liver abnormality. Gallbladder is contracted containing multiple stones. There is no pericholecystic inflammation identified. The common bile duct is dilated measuring up to 1.4 cm. Mild intrahepatic bile duct dilatation. Numerous tiny stones fill the mid and distal common bile duct as well as the cystic duct. The largest common bile duct stone measures 4 mm. The remaining common bile duct stones measure 2-3 mm. Pancreas: No mass, inflammatory changes, or other parenchymal abnormality identified. Spleen:  Within normal limits in size and appearance. Adrenals/Urinary Tract:  Normal adrenal glands. Arising off the anterior upper pole of left kidney is a mixed solid and cystic mass which measures 8.0 x 6.5 by 7.0 cm, image 8/4 and image 14/3. This shows some areas of intrinsic increased T1 signal compatible with hemorrhagic  component. The solid components show avid enhancement following the IV administration of contrast material. There is no signs of renal vein invasion. Right kidney appears normal. Stomach/Bowel: Visualized portions within the abdomen are unremarkable. Vascular/Lymphatic: No pathologically enlarged lymph nodes identified. No abdominal aortic aneurysm demonstrated. Other:  No ascites or focal fluid collections. Musculoskeletal: No suspicious bone lesions identified. IMPRESSION: 1. Cholelithiasis without signs of acute cholecystitis. 2. Numerous tiny stones fill the mid and distal common bile duct as well as the cystic duct. The largest common bile duct stone measures 4 mm. The remaining common bile duct stones measure 2-3 mm. 3. Mixed solid and cystic mass arising off the anterior upper pole of left kidney measures 8.0 x 6.5 x 7.0 cm. This is compatible with renal cell carcinoma. No signs of renal vein invasion or metastatic disease. Electronically Signed   By: Waddell Calk M.D.   On: 11/27/2023 11:56   MR 3D Recon At Scanner Result Date: 11/27/2023  CLINICAL DATA:  Pancreatitis. Gallstones. Evaluate for choledocholithiasis. EXAM: MRI ABDOMEN WITHOUT AND WITH CONTRAST (INCLUDING MRCP) TECHNIQUE: Multiplanar multisequence MR imaging of the abdomen was performed both before and after the administration of intravenous contrast. Heavily T2-weighted images of the biliary and pancreatic ducts were obtained, and three-dimensional MRCP images were rendered by post processing. CONTRAST:  5mL GADAVIST  GADOBUTROL  1 MMOL/ML IV SOLN COMPARISON:  Earlier today FINDINGS: Lower chest: No acute findings. Hepatobiliary: No focal no suspicious liver abnormality. Scratch no suspicious liver abnormality. Gallbladder is contracted containing multiple stones. There is no pericholecystic inflammation identified. The common bile duct is dilated measuring up to 1.4 cm. Mild intrahepatic bile duct dilatation. Numerous tiny stones fill the  mid and distal common bile duct as well as the cystic duct. The largest common bile duct stone measures 4 mm. The remaining common bile duct stones measure 2-3 mm. Pancreas: No mass, inflammatory changes, or other parenchymal abnormality identified. Spleen:  Within normal limits in size and appearance. Adrenals/Urinary Tract:  Normal adrenal glands. Arising off the anterior upper pole of left kidney is a mixed solid and cystic mass which measures 8.0 x 6.5 by 7.0 cm, image 8/4 and image 14/3. This shows some areas of intrinsic increased T1 signal compatible with hemorrhagic component. The solid components show avid enhancement following the IV administration of contrast material. There is no signs of renal vein invasion. Right kidney appears normal. Stomach/Bowel: Visualized portions within the abdomen are unremarkable. Vascular/Lymphatic: No pathologically enlarged lymph nodes identified. No abdominal aortic aneurysm demonstrated. Other:  No ascites or focal fluid collections. Musculoskeletal: No suspicious bone lesions identified. IMPRESSION: 1. Cholelithiasis without signs of acute cholecystitis. 2. Numerous tiny stones fill the mid and distal common bile duct as well as the cystic duct. The largest common bile duct stone measures 4 mm. The remaining common bile duct stones measure 2-3 mm. 3. Mixed solid and cystic mass arising off the anterior upper pole of left kidney measures 8.0 x 6.5 x 7.0 cm. This is compatible with renal cell carcinoma. No signs of renal vein invasion or metastatic disease. Electronically Signed   By: Waddell Calk M.D.   On: 11/27/2023 11:56   CT ABDOMEN PELVIS W CONTRAST Result Date: 11/27/2023 CLINICAL DATA:  Abdominal pain radiating to the back for 1 week. * Tracking Code: BO * EXAM: CT ABDOMEN AND PELVIS WITH CONTRAST TECHNIQUE: Multidetector CT imaging of the abdomen and pelvis was performed using the standard protocol following bolus administration of intravenous contrast.  RADIATION DOSE REDUCTION: This exam was performed according to the departmental dose-optimization program which includes automated exposure control, adjustment of the mA and/or kV according to patient size and/or use of iterative reconstruction technique. CONTRAST:  OMNIPAQUE  IOHEXOL  300 MG/ML  SOLN COMPARISON:  Today's ultrasound of the right upper quadrant. CT of 01/25/2006 is also reviewed. FINDINGS: Lower chest: Clear lung bases. Normal heart size without pericardial or pleural effusion. Hepatobiliary: Normal liver. The gallbladder is contracted with apparent wall thickening including on 32/2. No calcified stones. Mild intrahepatic biliary duct dilatation. Moderate common duct dilatation, including at 12 mm on coronal reformats. No calcified common duct stone. Pancreas: Normal, without mass or ductal dilatation. Spleen: Normal in size, without focal abnormality. Adrenals/Urinary Tract: Normal adrenal glands.  Normal right kidney. Off the anterior upper pole left kidney is a mixed solid and cystic mass which measures 6.8 x 7.6 by 6.5 cm. Normal urinary bladder. Stomach/Bowel: Normal stomach, without wall thickening. Normal colon, appendix, and terminal ileum. Normal  small bowel. Vascular/Lymphatic: Aortic atherosclerosis. Patent renal veins. No abdominopelvic adenopathy. Reproductive: Normal uterus and adnexa.  Prominent gonadal veins. Other: Trace free pelvic fluid is likely physiologic. No free intraperitoneal air. Musculoskeletal: No acute osseous abnormality. IMPRESSION: 1. 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 No renal vein involvement or metastatic disease. 2. Biliary duct dilatation without calcified obstructive stone. Correlate with bilirubin levels and consider MRCP. 3. Contracted gallbladder. Apparent wall thickening could be secondary or represent noncalcified gallstones versus adenomyomatosis. This would also be better evaluated on MRCP. 4. Prominent  gonadal veins as can be seen with pelvic congestion syndrome 5.  Aortic Atherosclerosis (ICD10-I70.0).  This is age advanced. Case discussed with Dr. Melvenia at 10:28 a.m. Electronically Signed   By: Rockey Kilts M.D.   On: 11/27/2023 10:29   US  Abdomen Limited Result Date: 11/27/2023 CLINICAL DATA:  Right upper quadrant pain EXAM: ULTRASOUND ABDOMEN LIMITED RIGHT UPPER QUADRANT COMPARISON:  CT 01/25/2006 FINDINGS: Gallbladder: The gallbladder is incompletely distended and poorly evaluated. Moderate wall thickening at 7 mm. Possible shadowing stone. Sonographic Beverley sign could not be evaluated as patient was pre-medicated. Common bile duct: Diameter: Moderately dilated at 1.7 cm. Probable common duct stone of 12 mm including on 20/6. Liver: Suspicion of mildly irregular hepatic contour. Portal vein is patent on color Doppler imaging with normal direction of blood flow towards the liver. Other: None. IMPRESSION: Biliary duct dilatation with probable choledocholithiasis. MRCP could confirm. Gallbladder poorly evaluated. Possible cholelithiasis. Nonspecific gallbladder wall thickening. Subtle irregular hepatic capsule for which mild cirrhosis cannot be excluded. This could be of added better evaluated on MRCP. Electronically Signed   By: Rockey Kilts M.D.   On: 11/27/2023 08:44     Procedures   Medications Ordered in the ED  HYDROmorphone  (DILAUDID ) injection 1 mg (has no administration in time range)  lactated ringers  infusion ( Intravenous New Bag/Given 11/27/23 0955)  lactated ringers  bolus 1,000 mL (0 mLs Intravenous Stopped 11/27/23 0908)  metoCLOPramide  (REGLAN ) injection 10 mg (10 mg Intravenous Given 11/27/23 0747)  HYDROmorphone  (DILAUDID ) injection 0.5 mg (0.5 mg Intravenous Given 11/27/23 0747)  iohexol  (OMNIPAQUE ) 300 MG/ML solution 100 mL (100 mLs Intravenous Contrast Given 11/27/23 0936)  gadobutrol  (GADAVIST ) 1 MMOL/ML injection 5 mL (5 mLs Intravenous Contrast Given 11/27/23 1117)                                     Medical Decision Making Amount and/or Complexity of Data Reviewed Labs: ordered. Radiology: ordered.  Risk Prescription drug management.   This patient presents to the ED for concern of abdominal pain, this involves an extensive number of treatment options, and is a complaint that carries with it a high risk of complications and morbidity.  The differential diagnosis includes pancreatitis, gastritis, PUD, cholecystitis, symptomatic cholelithiasis   Co morbidities / Chronic conditions that complicate the patient evaluation  Anxiety   Additional history obtained:  Additional history obtained from EMR External records from outside source obtained and reviewed including N/A   Lab Tests:  I Ordered, and personally interpreted labs.  The pertinent results include: Normal hemoglobin, no leukocytosis, normal kidney function, normal electrolytes.  Transaminases are mildly elevated and lipase is markedly elevated consistent with pancreatitis.   Imaging Studies ordered:  I ordered imaging studies including right upper quadrant ultrasound, CT of abdomen pelvis, MRCP I independently visualized and interpreted imaging which showed biliary duct dilatation  identified on ultrasound concerning for gallstone pancreatitis.  MRCP ordered to further evaluate.  CT scan showed incidental finding of large left renal mass most consistent with RCC. I agree with the radiologist interpretation   Cardiac Monitoring: / EKG:  The patient was maintained on a cardiac monitor.  I personally viewed and interpreted the cardiac monitored which showed an underlying rhythm of: Sinus rhythm   Problem List / ED Course / Critical interventions / Medication management  Patient presenting for 3 months of intermittent epigastric pain with nausea, typically worsened postprandially, with worsening severity of symptoms over the past several days.  Current pain is 8/10 in severity.  She does  endorse associated nausea.  She is overall well-appearing on exam.  Vital signs are reassuring.  Epigastric tenderness is present.  There is concern of gastritis/PUD/or cholecystitis.  Initial lab work shows no leukocytosis.  Imaging studies were ordered.  IV fluids were ordered for hydration.  Reglan , Dilaudid , and GI cocktail ordered for symptomatic relief.  Further lab work notable for mild elevations in transaminases and markedly elevated lipase consistent with pancreatitis.  Additional IV fluids and as needed Dilaudid  ordered.  Right upper: Ultrasound did show biliary duct dilatation, concerning for choledocholithiasis.  Patient confirms that she does not drink alcohol.  Gallstone pancreatitis is suspected.  MRCP was ordered.  GI was consulted.  I spoke with gastroenterologist, Dr. Dianna, who will see in consult.  Patient went CT scan which showed incidental finding of a large left renal mass concerning for RCC.  I spoke with urology, PA [facility], who will see the patient.  MRCP shows cholelithiasis with numerous tiny stones filling mid and distal CBD as well as cystic duct.  There is redemonstration of left renal mass as well.  PA Sattenfield evaluated the patient and recommends outpatient follow-up for robotic nephrectomy.  Contact information was inputted into AVS.  GI evaluated the patient and does request admission to hospitalist.  They do plan on ERCP but would like pancreatitis to improve first.  ERCP unlikely to be today or tomorrow.  On reassessment, patient reports current pain is un poco.  Patient was admitted for further management. I ordered medication including IV fluids for hydration, Dilaudid  for analgesia, Reglan  for nausea Reevaluation of the patient after these medicines showed that the patient improved I have reviewed the patients home medicines and have made adjustments as needed   Consultations Obtained:  I requested consultation with the gastroenterologist, Dr. Dianna,   and discussed lab and imaging findings as well as pertinent plan - they recommend: Admission to hospitalist, GI will follow in consult, possible ERCP after tomorrow if pancreatitis improves I requested consultation with the urology, PA Sattenfield,  and discussed lab and imaging findings as well as pertinent plan - they recommend: Outpatient follow-up for planned robotic nephrectomy.   Social Determinants of Health:  Non-English-speaking      Final diagnoses:  Acute pancreatitis without infection or necrosis, unspecified pancreatitis type  Left renal mass    ED Discharge Orders     None          Melvenia Motto, MD 11/27/23 1425

## 2023-11-27 NOTE — ED Notes (Signed)
 Patient states last PO 2100.  Patient states abdominal pain has been ongoing for three months intermittently.  Last mense 11/17/2023.  Denies alcohol use.

## 2023-11-27 NOTE — Consult Note (Signed)
 Referring Provider: Dr. Melvenia Primary Care Physician:  Patient, No Pcp Per Primary Gastroenterologist:  Sampson  Reason for Consultation:  Gallstone Pancreatitis  HPI: Donna Trevino is a 43 y.o. female who is Spanish-speaking only and translation provided by [place] translator Andra via [employer]) who has been having epigastric abdominal pain radiating to her back for the past 3 months that worsens with eating.  Episodes of nausea and vomiting as well this week.  Lipase > 2800, AST 170, ALT 172, TB 0.9, ALP 167.  Ultrasound, CT, and MRCP reviewed and noted in chart.  Has lost 9 pounds in the last month.  Denies any change in bowel movements, rectal bleeding or melena.  Also has been having left lower quadrant abdominal pain.  Denies previous diagnosis of pancreatitis.  Denies NSAIDs.  Denies alcohol.  Her 2 daughters are by the bedside.  Past Medical History:  Diagnosis Date   Anxiety    No pertinent past medical history     Past Surgical History:  Procedure Laterality Date   BREAST BIOPSY Right 05/23/2021   NO PAST SURGERIES      Prior to Admission medications   Medication Sig Start Date End Date Taking? Authorizing Provider  alum & mag hydroxide-simeth (MAALOX MAX) 400-400-40 MG/5ML suspension Take 10 mLs by mouth every 6 (six) hours as needed for indigestion. Patient not taking: Reported on 04/27/2022 02/28/21   Trine Raynell Moder, MD  cholecalciferol (VITAMIN D3) 25 MCG (1000 UNIT) tablet Take 1,000 Units by mouth daily.    [provider]  ferrous sulfate  325 (65 FE) MG tablet Take by mouth daily with breakfast.    [provider]  Hyoscyamine  Sulfate SL (LEVSIN /SL) 0.125 MG SUBL Place 1 each under the tongue 4 (four) times daily as needed for up to 5 days. 02/28/21 03/05/21  Trine Raynell Moder, MD  ibuprofen  (ADVIL ) 200 MG tablet Take 200 mg by mouth every 6 (six) hours as needed. 2 tablets (2) times daily oral as needed Patient not taking:  Reported on 04/27/2022    [provider]  medroxyPROGESTERone  (DEPO-SUBQ PROVERA  104) 104 MG/0.65ML injection Inject 104 mg into the skin every 3 (three) months. Patient not taking: Reported on 04/27/2022    [provider]  Multiple Vitamin (MULTIVITAMIN) tablet Take 1 tablet by mouth daily. Patient not taking: Reported on 04/27/2022    [provider]  vitamin B-12 (CYANOCOBALAMIN) 50 MCG tablet Take 50 mcg by mouth daily.    [provider]    Scheduled Meds: Continuous Infusions:  lactated ringers  125 mL/hr at 11/27/23 0955   PRN Meds:.HYDROmorphone  (DILAUDID ) injection  Allergies as of 11/27/2023 - Review Complete 11/27/2023  Allergen Reaction Noted   Penicillins  11/29/2020    Family History  Problem Relation Age of Onset   Stomach cancer Father    Breast cancer Maternal Aunt    Breast cancer Maternal Aunt    Anesthesia problems Neg Hx     Social History   Socioeconomic History   Marital status: Single    Spouse name: Not on file   Number of children: Not on file   Years of education: Not on file   Highest education level: 6th grade  Occupational History   Not on file  Tobacco Use   Smoking status: Never   Smokeless tobacco: Never  Vaping Use   Vaping status: Never Used  Substance and Sexual Activity   Alcohol use: No   Drug use: No   Sexual activity: Yes  Birth control/protection: Injection  Other Topics Concern   Not on file  Social History Narrative   Not on file   Social Drivers of Health   Financial Resource Strain: Not on file  Food Insecurity: No Food Insecurity (04/27/2022)   Hunger Vital Sign    Worried About Running Out of Food in the Last Year: Never true    Ran Out of Food in the Last Year: Never true  Transportation Needs: No Transportation Needs (04/27/2022)   PRAPARE - Administrator, Civil Service (Medical): No    Lack of Transportation (Non-Medical): No  Physical Activity: Not on  file  Stress: Not on file  Social Connections: Not on file  Intimate Partner Violence: Not on file    Review of Systems: All negative except as stated above in HPI.  Physical Exam: Vital signs: Vitals:   11/27/23 0954 11/27/23 1318  BP: (!) 151/94 (!) 139/90  Pulse: 90 76  Resp: 18 20  Temp: 97.9 F (36.6 C) 97.9 F (36.6 C)  SpO2: 100% 100%     General: Lethargic, thin, no acute distress, pleasant  Head: normocephalic, atraumatic Eyes: anicteric sclera ENT: oropharynx clear Neck: supple, nontender Lungs:  Clear throughout to auscultation.   No wheezes, crackles, or rhonchi. No acute distress. Heart:  Regular rate and rhythm; no murmurs, clicks, rubs,  or gallops. Abdomen: Epigastric tenderness with guarding, left lower quadrant tenderness with guarding, soft, nondistended, positive bowel sounds Rectal:  Deferred Ext: no edema  GI:  Lab Results: Recent Labs    11/27/23 0625  WBC 9.9  HGB 12.9  HCT 40.5  PLT 280   BMET Recent Labs    11/27/23 0625  NA 136  K 3.7  CL 103  CO2 27  GLUCOSE 103*  BUN 10  CREATININE 0.67  CALCIUM 9.0   LFT Recent Labs    11/27/23 0625  PROT 7.3  ALBUMIN 4.0  AST 170*  ALT 172*  ALKPHOS 167*  BILITOT 0.9   PT/INR No results for input(s): LABPROT, INR in the last 72 hours.   Studies/Results:   Impression/Plan: Gallstone pancreatitis with MRCP concerning for distal common bile duct stones.  Will need a ERCP this hospitalization but need to wait for pancreatitis to improve prior to doing so.  I would recommend gallbladder surgery as well this hospitalization but defer to surgery.  Strict NPO.  IV fluids.  Supportive care.  Consider ERCP in 2 to 3 days pending improvement in pancreatitis.  Discussed risk and benefits of ERCP with patient and she agrees to proceed.  Will follow-up.    LOS: 0 days   [name]  11/27/2023, 2:19 PM  Questions please call [phone]

## 2023-11-27 NOTE — ED Notes (Signed)
Assisted pt to bathroom

## 2023-11-27 NOTE — ED Notes (Signed)
Report given to Frances, RN

## 2023-11-28 ENCOUNTER — Encounter (HOSPITAL_COMMUNITY)
Admission: EM | Disposition: A | Discharge status: Home / Self Care | Payer: Self-pay | Source: Home / Self Care | Attending: Family Medicine

## 2023-11-28 ENCOUNTER — Encounter (HOSPITAL_COMMUNITY): Payer: Self-pay | Admitting: *Deleted

## 2023-11-28 ENCOUNTER — Observation Stay (HOSPITAL_COMMUNITY)

## 2023-11-28 DIAGNOSIS — K859 Acute pancreatitis without necrosis or infection, unspecified: Secondary | ICD-10-CM

## 2023-11-28 DIAGNOSIS — F419 Anxiety disorder, unspecified: Secondary | ICD-10-CM

## 2023-11-28 DIAGNOSIS — R7989 Other specified abnormal findings of blood chemistry: Secondary | ICD-10-CM

## 2023-11-28 DIAGNOSIS — N2889 Other specified disorders of kidney and ureter: Secondary | ICD-10-CM

## 2023-11-28 DIAGNOSIS — K8050 Calculus of bile duct without cholangitis or cholecystitis without obstruction: Secondary | ICD-10-CM

## 2023-11-28 HISTORY — PX: ERCP: SHX5425

## 2023-11-28 LAB — CBC
HCT: 35.7 % — ABNORMAL LOW (ref 36.0–46.0)
Hemoglobin: 11.7 g/dL — ABNORMAL LOW (ref 12.0–15.0)
MCH: 32.2 pg (ref 26.0–34.0)
MCHC: 32.8 g/dL (ref 30.0–36.0)
MCV: 98.3 fL (ref 80.0–100.0)
Platelets: 262 K/uL (ref 150–400)
RBC: 3.63 MIL/uL — ABNORMAL LOW (ref 3.87–5.11)
RDW: 11.9 % (ref 11.5–15.5)
WBC: 7.5 K/uL (ref 4.0–10.5)
nRBC: 0 % (ref 0.0–0.2)

## 2023-11-28 LAB — COMPREHENSIVE METABOLIC PANEL WITH GFR
ALT: 131 U/L — ABNORMAL HIGH (ref 0–44)
AST: 62 U/L — ABNORMAL HIGH (ref 15–41)
Albumin: 3.3 g/dL — ABNORMAL LOW (ref 3.5–5.0)
Alkaline Phosphatase: 131 U/L — ABNORMAL HIGH (ref 38–126)
Anion gap: 7 (ref 5–15)
BUN: <5 mg/dL — ABNORMAL LOW (ref 6–20)
CO2: 23 mmol/L (ref 22–32)
Calcium: 8.1 mg/dL — ABNORMAL LOW (ref 8.9–10.3)
Chloride: 105 mmol/L (ref 98–111)
Creatinine, Ser: 0.53 mg/dL (ref 0.44–1.00)
GFR, Estimated: >60 mL/min (ref >60)
Glucose, Bld: 98 mg/dL (ref 70–99)
Potassium: 3.3 mmol/L — ABNORMAL LOW (ref 3.5–5.1)
Sodium: 135 mmol/L (ref 135–145)
Total Bilirubin: 1 mg/dL (ref 0.0–1.2)
Total Protein: 6.1 g/dL — ABNORMAL LOW (ref 6.5–8.1)

## 2023-11-28 LAB — HIV ANTIBODY (ROUTINE TESTING W REFLEX): HIV Screen 4th Generation wRfx: NONREACTIVE

## 2023-11-28 LAB — LIPASE, BLOOD: Lipase: 93 U/L — ABNORMAL HIGH (ref 11–51)

## 2023-11-28 SURGERY — ERCP, WITH INTERVENTION IF INDICATED
Anesthesia: General

## 2023-11-28 MED ORDER — AMISULPRIDE (ANTIEMETIC) 5 MG/2ML IV SOLN
10.0000 mg | Freq: Once | INTRAVENOUS | Status: AC
  Administered 2023-11-28: 10 mg via INTRAVENOUS

## 2023-11-28 MED ORDER — LACTATED RINGERS IV SOLN
INTRAVENOUS | Status: AC | PRN
  Administered 2023-11-28: 1000 mL via INTRAVENOUS

## 2023-11-28 MED ORDER — SODIUM CHLORIDE 0.9 % IV SOLN: INTRAVENOUS | Status: DC

## 2023-11-28 MED ORDER — PROPOFOL 10 MG/ML IV BOLUS
INTRAVENOUS | Status: DC | PRN
Start: 2023-11-28 — End: 2023-11-28
  Administered 2023-11-28: 160 mg via INTRAVENOUS

## 2023-11-28 MED ORDER — AMISULPRIDE (ANTIEMETIC) 5 MG/2ML IV SOLN
INTRAVENOUS | Status: AC
  Filled 2023-11-28: qty 4

## 2023-11-28 MED ORDER — LIDOCAINE HCL (CARDIAC) PF 100 MG/5ML IV SOSY
PREFILLED_SYRINGE | INTRAVENOUS | Status: DC | PRN
  Administered 2023-11-28: 50 mg via INTRAVENOUS

## 2023-11-28 MED ORDER — SODIUM CHLORIDE 0.9 % IV SOLN
INTRAVENOUS | Status: DC | PRN
  Administered 2023-11-28: 30 mL

## 2023-11-28 MED ORDER — GLUCAGON HCL RDNA (DIAGNOSTIC) 1 MG IJ SOLR
INTRAMUSCULAR | Status: AC
  Filled 2023-11-28: qty 1

## 2023-11-28 MED ORDER — CIPROFLOXACIN IN D5W 400 MG/200ML IV SOLN
INTRAVENOUS | Status: DC | PRN
  Administered 2023-11-28: 400 mg via INTRAVENOUS

## 2023-11-28 MED ORDER — PROPOFOL 10 MG/ML IV BOLUS
INTRAVENOUS | Status: AC
  Filled 2023-11-28: qty 20

## 2023-11-28 MED ORDER — DICLOFENAC SUPPOSITORY 100 MG
RECTAL | Status: DC | PRN
  Administered 2023-11-28: 100 mg via RECTAL

## 2023-11-28 MED ORDER — ONDANSETRON HCL 4 MG/2ML IJ SOLN
INTRAMUSCULAR | Status: DC | PRN
  Administered 2023-11-28: 4 mg via INTRAVENOUS

## 2023-11-28 MED ORDER — SUGAMMADEX SODIUM 200 MG/2ML IV SOLN
INTRAVENOUS | Status: DC | PRN
  Administered 2023-11-28: 200 mg via INTRAVENOUS

## 2023-11-28 MED ORDER — ROCURONIUM BROMIDE 100 MG/10ML IV SOLN
INTRAVENOUS | Status: DC | PRN
  Administered 2023-11-28: 40 mg via INTRAVENOUS

## 2023-11-28 MED ORDER — CIPROFLOXACIN IN D5W 400 MG/200ML IV SOLN
INTRAVENOUS | Status: AC
  Filled 2023-11-28: qty 200

## 2023-11-28 MED ORDER — DICLOFENAC SUPPOSITORY 100 MG
RECTAL | Status: AC
  Filled 2023-11-28: qty 1

## 2023-11-28 MED ORDER — FENTANYL CITRATE (PF) 100 MCG/2ML IJ SOLN
INTRAMUSCULAR | Status: DC | PRN
  Administered 2023-11-28: 25 ug via INTRAVENOUS
  Administered 2023-11-28: 75 ug via INTRAVENOUS

## 2023-11-28 MED ORDER — FENTANYL CITRATE (PF) 100 MCG/2ML IJ SOLN
INTRAMUSCULAR | Status: AC
  Filled 2023-11-28: qty 2

## 2023-11-28 MED ORDER — POTASSIUM CHLORIDE 10 MEQ/100ML IV SOLN
10.0000 meq | Freq: Once | INTRAVENOUS | Status: AC
  Administered 2023-11-28: 10 meq via INTRAVENOUS
  Filled 2023-11-28: qty 100

## 2023-11-28 MED ORDER — MORPHINE SULFATE (PF) 2 MG/ML IV SOLN
2.0000 mg | INTRAVENOUS | Status: DC | PRN
  Administered 2023-11-28 – 2023-11-29 (×3): 2 mg via INTRAVENOUS
  Filled 2023-11-28 (×3): qty 1

## 2023-11-28 MED ORDER — DEXAMETHASONE SODIUM PHOSPHATE 10 MG/ML IJ SOLN
INTRAMUSCULAR | Status: DC | PRN
  Administered 2023-11-28: 4 mg via INTRAVENOUS

## 2023-11-28 NOTE — Anesthesia Postprocedure Evaluation (Signed)
 Anesthesia Post Note  Patient: International aid/development worker  Procedure(s) Performed: ERCP, WITH INTERVENTION IF INDICATED     Patient location during evaluation: PACU Anesthesia Type: General Level of consciousness: awake and alert Pain management: pain level controlled Vital Signs Assessment: post-procedure vital signs reviewed and stable Respiratory status: spontaneous breathing, nonlabored ventilation and respiratory function stable Cardiovascular status: blood pressure returned to baseline and stable Postop Assessment: no apparent nausea or vomiting Anesthetic complications: no   No notable events documented.  Last Vitals:  Vitals:   11/28/23 1700 11/28/23 1721  BP: (!) 155/93 (!) 143/96  Pulse: (!) 102 100  Resp: 15 15  Temp:  36.7 C  SpO2: 97% 100%    Last Pain:  Vitals:   11/28/23 1721  TempSrc: Oral  PainSc: 7                  Butler Levander Pinal

## 2023-11-28 NOTE — Op Note (Signed)
 [facility] Patient Name: Donna Trevino Procedure Date: 11/28/2023 MRN: [id number] Attending MD: Oliva Boots , MD, [id number] Date of Birth: June 17, 1980 CSN: [id number] Age: 43 Admit Type: Inpatient Procedure:                ERCP Indications:              Evaluation and possible treatment of bile duct                            stone(s) Providers:                Oliva Boots, MDLisa Tess, RN, , Fairy Marina,                            Technician Referring MD:              Medicines:                General Anesthesia Complications:            No immediate complications. Estimated Blood Loss:     Estimated blood loss was minimal. Not from                            sphincterotomy but minimal heme with pulling the                            stones out which stopped readily midway through the                            procedure Procedure:                Pre-Anesthesia Assessment:                           - Prior to the procedure, a History and Physical                            was performed, and patient medications and                            allergies were reviewed. The patient's tolerance of                            previous anesthesia was also reviewed. The risks                            and benefits of the procedure and the sedation                            options and risks were discussed with the patient.                            All questions were answered, and informed consent                            was obtained. Prior Anticoagulants: The patient has  taken no anticoagulant or antiplatelet agents. ASA                            Grade Assessment: II - A patient with mild systemic                            disease. After reviewing the risks and benefits,                            the patient was deemed in satisfactory condition to                            undergo the procedure.                            After obtaining informed consent, the scope was                            passed under direct vision. Throughout the                            procedure, the patient's blood pressure, pulse, and                            oxygen saturations were monitored continuously. The                            [id number]-Q190V ([id number] Olympus duodenoscope was                            introduced through the mouth, and used to inject                            contrast into and used to locate the major papilla.                            The ERCP was accomplished without difficulty. The                            patient tolerated the procedure well. Scope In: Scope Out: Findings:      The major papilla was normal. Unfortunately the wire went towards the       pancreas twice and on the third time we elected to place one 4 Fr by 3       cm temporary plastic pancreatic stent with a 3/4 external pigtail and no       internal flaps was placed 2 cm into the ventral pancreatic duct. The       stent was in good position. Deep selective cannulation was then readily       obtained and obvious stones were seen on the initial cholangiogram and       we proceeded with a biliary sphincterotomy was made with a Hydratome       sphincterotome using ERBE electrocautery. There was no       post-sphincterotomy bleeding. We had adequate biliary  drainage and could       get the fully bowed sphincterotome easily in and out of the duct and to       discover objects, the biliary tree was swept with a 12 mm balloon       starting at the bifurcation multiple times. All stones were removed.       Nothing was found on multiple balloon pull-through's and on occlusion       cholangiogram. But to be sure there was no residual stones and to       discover objects, the biliary tree was swept with a 15 mm balloon       starting at the bifurcation. A little sludge was swept from the duct but       unfortunately when this larger balloon  was pulled through the patent       sphincterotomy site the PD stent was pushed out of the duct and One PD       stent was removed from Duodenum using a regular forceps. A second       occlusion cholangiogram was done with a larger balloon which did not       reveal any residual abnormalities and all balloons passed readily       through the pain sphincterotomy site and there was adequate biliary       drainage and the balloon wire and scope were removed and the patient       tolerated the procedure well Impression:               - The major papilla appeared normal.                           - Choledocholithiasis was found. Complete removal                            was accomplished by biliary sphincterotomy and                            balloon extraction.                           - One temporary plastic pancreatic stent was placed                            into the ventral pancreatic duct.                           - A biliary sphincterotomy was performed.                           - The biliary tree was swept and nothing was found.                            At the end of the procedure                           - The biliary tree was swept and sludge was found  with a larger balloon.                           - One stent was removed from Duodenum. Moderate Sedation:      Not Applicable - Patient had care per Anesthesia. Recommendation:           - Clear liquid diet today. N.p.o. after midnight                            for possible lap chole tomorrow                           - Continue present medications.                           - Check lipase, liver enzymes (AST, ALT, alkaline                            phosphatase, bilirubin) and hemogram with white                            blood cell count and platelets in the morning.                           - Return to GI clinic PRN.                           - Telephone GI clinic if symptomatic PRN.                            - Refer to a surgeon today. Procedure Code(s):        --- Professional ---                           [phone], Esophagogastroduodenoscopy, flexible,                            transoral; with removal of foreign body(s) Diagnosis Code(s):        --- Professional ---                           K80.50, Calculus of bile duct without cholangitis                            or cholecystitis without obstruction                           Z46.59, Encounter for fitting and adjustment of                            other gastrointestinal appliance and device CPT copyright 2022 American [employer] Association. All rights reserved. The codes documented in this report are preliminary and upon coder review may  be revised to meet current compliance requirements. Oliva Boots, MD 11/28/2023 4:22:58 PM This report has been signed electronically. Number of Addenda: 0

## 2023-11-28 NOTE — Progress Notes (Signed)
 [facility] Gastroenterology Progress Note  Donna Trevino 43 y.o. [birth year]   Subjective: Denies epigastric pain/N/V. Spanish translator [facility] # [id number]) used for this evaluation.  Objective: Vital signs: Vitals:   11/28/23 0616 11/28/23 0842  BP: 126/85 124/86  Pulse: 80 79  Resp: 18 16  Temp: 97.9 F (36.6 C) 98.3 F (36.8 C)  SpO2: 100% 100%    Physical Exam: Gen: lethargic, well-nourished, no acute distress, pleasant HEENT: anicteric sclera CV: RRR Chest: CTA B Abd: LLQ tenderness with guarding, otherwise nontender, soft, nondistended, +BS Ext: no edema  Lab Results: Recent Labs    11/27/23 0625 11/28/23 0446  NA 136 135  K 3.7 3.3*  CL 103 105  CO2 27 23  GLUCOSE 103* 98  BUN 10 <5*  CREATININE 0.67 0.53  CALCIUM 9.0 8.1*   Recent Labs    11/27/23 0625 11/28/23 0446  AST 170* 62*  ALT 172* 131*  ALKPHOS 167* 131*  BILITOT 0.9 1.0  PROT 7.3 6.1*  ALBUMIN 4.0 3.3*   Recent Labs    11/27/23 0625 11/28/23 0446  WBC 9.9 7.5  NEUTROABS 7.6  --   HGB 12.9 11.7*  HCT 40.5 35.7*  MCV 98.8 98.3  PLT 280 262      Assessment/Plan: Gallstone pancreatitis with choledocholithiasis on imaging - NPO for ERCP this afternoon by Dr. Rosalie. Supportive care.   Donna Trevino 11/28/2023, 11:41 AM  Questions please call [phone] ID: Donna Trevino, female   DOB: 1980-09-30, 43 y.o.   MRN: [id number]

## 2023-11-28 NOTE — Progress Notes (Signed)
 Donna Trevino 3:23 PM  Subjective: Patient seen and examined and case discussed with her daughter who translated and she is having less pain but on her upper left side and we rediscussed the procedure including the risk benefits methods and success rate and answered all of their questions and her hospital computer chart reviewed and her case discussed with my partner Donna Trevino  Objective: Vital signs stable afebrile no acute distress lying comfortably in the bed exam please see preassessment evaluation labs and MRCP reviewed  Assessment: CBD stones  Plan: The risk benefits methods and success rate of ERCP and stone extraction was discussed with the patient and her daughter and we will proceed today with anesthesia assistance and probable lap chole tomorrow if well-tolerated and doing okay in a.m.  [facility]  office [phone] After 5PM or if no answer call [phone]

## 2023-11-28 NOTE — Progress Notes (Addendum)
 [facility]  PROGRESS NOTE  Donna Trevino FMW:[id number] DOB: Mar 30, 1981 DOA: 11/27/2023 PCP: Patient, No Pcp Per   Brief HPI:    43 y.o. female with medical history significant of anxiety who presented to the emergency department complaints of abdominal pain for a week that worsens with food intake.  The pain is colicky in nature and radiates to her back  Abdominal ultrasound showed biliary duct dilatation with probable choledocholithiasis MRCP recommended. Gallbladder poorly evaluated possible cholelithiasis. Mild cirrhosis could not be excluded. 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 No renal vein involvement or metastatic disease. Biliary dilatation without calcified obstructive stone. MRCP recommende    Assessment/Plan:   Acute pancreatitis -Abdominal ultrasound showed biliary ductal dilatation with probable choledocholithiasis - MRCP abdomen showed numerous tiny stones in the mid and distal common bile duct as well as cystic duct.  Largest common bile duct stone measuring 4 mm -Lipase was elevated at 2800, Lipase is down to 93  -Gastroenterology consulted Plan for ERCP today -They also recommended possible cholecystectomy.   Active Problems:   Renal mass Seen by urology. -Referrals placed for Dr. Sherrilee or Dr. Francisca.     Abnormal LFTs In the setting of choledocholithiasis. LFTs are improving  Hypokalemia - Potassium is 3.3 - Will replace with IV potassium - Follow potassium level in a.m.    Medications     enoxaparin  (LOVENOX ) injection  40 mg Subcutaneous Q24H   pantoprazole  (PROTONIX ) IV  40 mg Intravenous Q24H     Data Reviewed:   CBG:  No results for input(s): GLUCAP in the last 168 hours.  SpO2: 100 %    Vitals:   11/27/23 1825 11/27/23 2129 11/28/23 0127 11/28/23 0616  BP: 130/77 125/71 121/76 126/85  Pulse: 80 79 84 80  Resp: 17 18 18 18    Temp: 98.6 F (37 C) 97.8 F (36.6 C) 97.8 F (36.6 C) 97.9 F (36.6 C)  TempSrc: Oral Oral Oral Oral  SpO2: 100% 100% 100% 100%  Weight:      Height:          Data Reviewed:  Basic Metabolic Panel: Recent Labs  Lab 11/27/23 0625 11/28/23 0446  NA 136 135  K 3.7 3.3*  CL 103 105  CO2 27 23  GLUCOSE 103* 98  BUN 10 <5*  CREATININE 0.67 0.53  CALCIUM 9.0 8.1*    CBC: Recent Labs  Lab 11/27/23 0625 11/28/23 0446  WBC 9.9 7.5  NEUTROABS 7.6  --   HGB 12.9 11.7*  HCT 40.5 35.7*  MCV 98.8 98.3  PLT 280 262    LFT Recent Labs  Lab 11/27/23 0625 11/28/23 0446  AST 170* 62*  ALT 172* 131*  ALKPHOS 167* 131*  BILITOT 0.9 1.0  PROT 7.3 6.1*  ALBUMIN 4.0 3.3*     Antibiotics: Anti-infectives (From admission, onward)    None        DVT prophylaxis: Lovenox   Code Status: Full code  Family Communication: No family at bedside   CONSULTS gastroenterology   Subjective   Denies abdominal pain   Objective    Physical Examination:   General-appears in no acute distress Heart-S1-S2, regular, no murmur auscultated Lungs-clear to auscultation bilaterally, no wheezing or crackles auscultated Abdomen-soft, nontender, no organomegaly Extremities-no edema in the lower extremities Neuro-alert, oriented x3, no focal deficit noted  Status is: Inpatient:             Donna Trevino  Donna Trevino   [facility] If 7PM-7AM, please contact night-coverage at [email or web address], Office  [phone]   11/28/2023, 8:01 AM  LOS: 0 days

## 2023-11-28 NOTE — Transfer of Care (Signed)
 Immediate Anesthesia Transfer of Care Note  Patient: Donna Trevino  Procedure(s) Performed: ERCP, WITH INTERVENTION IF INDICATED  Patient Location: PACU  Anesthesia Type:General  Level of Consciousness: awake, alert , and oriented  Airway & Oxygen Therapy: Patient Spontanous Breathing  Post-op Assessment: Report given to RN and Post -op Vital signs reviewed and stable  Post vital signs: Reviewed and stable  Last Vitals:  Vitals Value Taken Time  BP 151/85 11/28/23 16:30  Temp    Pulse 123 11/28/23 16:32  Resp 20 11/28/23 16:32  SpO2 96 % 11/28/23 16:32  Vitals shown include unfiled device data.  Last Pain:  Vitals:   11/28/23 1435  TempSrc: Temporal  PainSc: 7          Complications: No notable events documented.

## 2023-11-28 NOTE — Anesthesia Procedure Notes (Signed)
 Procedure Name: Intubation Date/Time: 11/28/2023 4:00 PM  Performed by: Gladis Honey, CRNAPre-anesthesia Checklist: Patient identified, Emergency Drugs available, Suction available, Patient being monitored and Timeout performed Patient Re-evaluated:Patient Re-evaluated prior to induction Oxygen Delivery Method: Circle system utilized Preoxygenation: Pre-oxygenation with 100% oxygen Induction Type: IV induction Ventilation: Mask ventilation without difficulty Laryngoscope Size: Miller and 2 Grade View: Grade I Tube type: Oral Tube size: 7.0 mm Number of attempts: 1 Airway Equipment and Method: Stylet Placement Confirmation: ETT inserted through vocal cords under direct vision, positive ETCO2 and breath sounds checked- equal and bilateral Secured at: 21 cm Tube secured with: Tape Dental Injury: Teeth and Oropharynx as per pre-operative assessment

## 2023-11-28 NOTE — Anesthesia Preprocedure Evaluation (Addendum)
 Anesthesia Evaluation  Patient identified by MRN, date of birth, ID band Patient awake    Reviewed: Allergy & Precautions, NPO status , Patient's Chart, lab work & pertinent test results  Airway Mallampati: II  TM Distance: >3 FB Neck ROM: Full    Dental no notable dental hx. (+) Teeth Intact, Dental Advisory Given   Pulmonary neg pulmonary ROS   Pulmonary exam normal breath sounds clear to auscultation       Cardiovascular negative cardio ROS Normal cardiovascular exam Rhythm:Regular Rate:Normal     Neuro/Psych   Anxiety     negative neurological ROS     GI/Hepatic negative GI ROS, Neg liver ROS,,,Lab Results      Component                Value               Date                      ALT                      131 (H)             11/28/2023                AST                      62 (H)              11/28/2023                ALKPHOS                  131 (H)             11/28/2023                BILITOT                  1.0                 11/28/2023              Endo/Other    Renal/GU Renal diseaseLab Results      Component                Value               Date                      NA                       135                 11/28/2023                CL                       105                 11/28/2023                K                        3.3 (L)             11/28/2023  CO2                      23                  11/28/2023                BUN                      <5 (L)              11/28/2023                CREATININE               0.53                11/28/2023                GFRNONAA                 >60                 11/28/2023                CALCIUM                  8.1 (L)             11/28/2023                ALBUMIN                  3.3 (L)             11/28/2023                GLUCOSE                  98                  11/28/2023                Musculoskeletal negative  musculoskeletal ROS (+)    Abdominal   Peds  Hematology Lab Results      Component                Value               Date                      WBC                      7.5                 11/28/2023                HGB                      11.7 (L)            11/28/2023                HCT                      35.7 (L)            11/28/2023                MCV  98.3                11/28/2023                PLT                      262                 11/28/2023              Anesthesia Other Findings All: PCN  Reproductive/Obstetrics                              Anesthesia Physical Anesthesia Plan  ASA: 3  Anesthesia Plan: General   Post-op Pain Management: Minimal or no pain anticipated and Regional block*   Induction: Intravenous  PONV Risk Score and Plan: Treatment may vary due to age or medical condition and Ondansetron   Airway Management Planned: Oral ETT  Additional Equipment: None  Intra-op Plan:   Post-operative Plan: Extubation in OR  Informed Consent:      Interpreter used for interview  Plan Discussed with:   Anesthesia Plan Comments:          Anesthesia Quick Evaluation

## 2023-11-29 ENCOUNTER — Other Ambulatory Visit: Payer: Self-pay

## 2023-11-29 ENCOUNTER — Inpatient Hospital Stay (HOSPITAL_COMMUNITY): Admitting: Anesthesiology

## 2023-11-29 ENCOUNTER — Encounter (HOSPITAL_COMMUNITY)
Admission: EM | Disposition: A | Discharge status: Home / Self Care | Payer: Self-pay | Source: Home / Self Care | Attending: Family Medicine

## 2023-11-29 ENCOUNTER — Encounter (HOSPITAL_COMMUNITY): Payer: Self-pay | Admitting: Gastroenterology

## 2023-11-29 DIAGNOSIS — K8070 Calculus of gallbladder and bile duct without cholecystitis without obstruction: Secondary | ICD-10-CM

## 2023-11-29 HISTORY — PX: CHOLECYSTECTOMY: SHX55

## 2023-11-29 LAB — CBC
HCT: 36.7 % (ref 36.0–46.0)
Hemoglobin: 12.2 g/dL (ref 12.0–15.0)
MCH: 32.5 pg (ref 26.0–34.0)
MCHC: 33.2 g/dL (ref 30.0–36.0)
MCV: 97.9 fL (ref 80.0–100.0)
Platelets: 279 K/uL (ref 150–400)
RBC: 3.75 MIL/uL — ABNORMAL LOW (ref 3.87–5.11)
RDW: 11.9 % (ref 11.5–15.5)
WBC: 7.1 K/uL (ref 4.0–10.5)
nRBC: 0 % (ref 0.0–0.2)

## 2023-11-29 LAB — COMPREHENSIVE METABOLIC PANEL WITH GFR
ALT: 173 U/L — ABNORMAL HIGH (ref 0–44)
AST: 93 U/L — ABNORMAL HIGH (ref 15–41)
Albumin: 3.6 g/dL (ref 3.5–5.0)
Alkaline Phosphatase: 173 U/L — ABNORMAL HIGH (ref 38–126)
Anion gap: 9 (ref 5–15)
BUN: 7 mg/dL (ref 6–20)
CO2: 24 mmol/L (ref 22–32)
Calcium: 8.7 mg/dL — ABNORMAL LOW (ref 8.9–10.3)
Chloride: 103 mmol/L (ref 98–111)
Creatinine, Ser: 0.6 mg/dL (ref 0.44–1.00)
GFR, Estimated: >60 mL/min (ref >60)
Glucose, Bld: 97 mg/dL (ref 70–99)
Potassium: 3.7 mmol/L (ref 3.5–5.1)
Sodium: 136 mmol/L (ref 135–145)
Total Bilirubin: 1.1 mg/dL (ref 0.0–1.2)
Total Protein: 7 g/dL (ref 6.5–8.1)

## 2023-11-29 LAB — LIPASE, BLOOD: Lipase: 66 U/L — ABNORMAL HIGH (ref 11–51)

## 2023-11-29 SURGERY — LAPAROSCOPIC CHOLECYSTECTOMY
Anesthesia: General

## 2023-11-29 MED ORDER — MORPHINE SULFATE (PF) 2 MG/ML IV SOLN
2.0000 mg | INTRAVENOUS | Status: DC | PRN
  Administered 2023-11-29 – 2023-11-30 (×4): 2 mg via INTRAVENOUS
  Filled 2023-11-29 (×4): qty 1

## 2023-11-29 MED ORDER — SCOPOLAMINE 1 MG/3DAYS TD PT72
1.0000 | MEDICATED_PATCH | Freq: Once | TRANSDERMAL | Status: DC
  Administered 2023-11-29: 1.5 mg via TRANSDERMAL
  Filled 2023-11-29: qty 1

## 2023-11-29 MED ORDER — BUPIVACAINE-EPINEPHRINE (PF) 0.25% -1:200000 IJ SOLN
INTRAMUSCULAR | Status: AC
  Filled 2023-11-29: qty 30

## 2023-11-29 MED ORDER — LACTATED RINGERS IV SOLN
INTRAVENOUS | Status: AC | PRN
  Administered 2023-11-29: 1000 mL via INTRAVENOUS

## 2023-11-29 MED ORDER — ACETAMINOPHEN 500 MG PO TABS
1000.0000 mg | ORAL_TABLET | Freq: Four times a day (QID) | ORAL | Status: DC
  Administered 2023-11-29 – 2023-12-01 (×5): 1000 mg via ORAL
  Filled 2023-11-29 (×7): qty 2

## 2023-11-29 MED ORDER — DROPERIDOL 2.5 MG/ML IJ SOLN
0.6250 mg | Freq: Once | INTRAMUSCULAR | Status: DC | PRN
Start: 2023-11-29 — End: 2023-11-29

## 2023-11-29 MED ORDER — MIDAZOLAM HCL 2 MG/2ML IJ SOLN
INTRAMUSCULAR | Status: AC
  Filled 2023-11-29: qty 2

## 2023-11-29 MED ORDER — ACETAMINOPHEN 500 MG PO TABS
1000.0000 mg | ORAL_TABLET | Freq: Once | ORAL | Status: AC
  Administered 2023-11-29: 1000 mg via ORAL
  Filled 2023-11-29: qty 2

## 2023-11-29 MED ORDER — PROPOFOL 10 MG/ML IV BOLUS
INTRAVENOUS | Status: DC | PRN
  Administered 2023-11-29: 110 mg via INTRAVENOUS

## 2023-11-29 MED ORDER — LACTATED RINGERS IV SOLN: INTRAVENOUS | Status: AC

## 2023-11-29 MED ORDER — OXYCODONE HCL 5 MG PO TABS: 5.0000 mg | ORAL_TABLET | Freq: Once | ORAL | Status: DC | PRN

## 2023-11-29 MED ORDER — CEFAZOLIN SODIUM-DEXTROSE 2-4 GM/100ML-% IV SOLN
2.0000 g | Freq: Once | INTRAVENOUS | Status: AC
  Administered 2023-11-29: 2 g via INTRAVENOUS
  Filled 2023-11-29: qty 100

## 2023-11-29 MED ORDER — OXYCODONE HCL 5 MG PO TABS
5.0000 mg | ORAL_TABLET | ORAL | Status: DC | PRN
  Administered 2023-11-29 – 2023-12-01 (×5): 10 mg via ORAL
  Filled 2023-11-29 (×5): qty 2

## 2023-11-29 MED ORDER — ONDANSETRON HCL 4 MG/2ML IJ SOLN
INTRAMUSCULAR | Status: DC | PRN
  Administered 2023-11-29: 4 mg via INTRAVENOUS

## 2023-11-29 MED ORDER — ONDANSETRON HCL 4 MG/2ML IJ SOLN
INTRAMUSCULAR | Status: AC
Start: 2023-11-29 — End: 2023-11-29
  Filled 2023-11-29: qty 2

## 2023-11-29 MED ORDER — FENTANYL CITRATE PF 50 MCG/ML IJ SOSY
PREFILLED_SYRINGE | INTRAMUSCULAR | Status: AC
Start: 2023-11-29 — End: 2023-11-29
  Filled 2023-11-29: qty 1

## 2023-11-29 MED ORDER — PROPOFOL 10 MG/ML IV BOLUS
INTRAVENOUS | Status: AC
  Filled 2023-11-29: qty 20

## 2023-11-29 MED ORDER — PHENYLEPHRINE 80 MCG/ML (10ML) SYRINGE FOR IV PUSH (FOR BLOOD PRESSURE SUPPORT)
PREFILLED_SYRINGE | INTRAVENOUS | Status: AC
  Filled 2023-11-29: qty 10

## 2023-11-29 MED ORDER — DEXAMETHASONE SODIUM PHOSPHATE 10 MG/ML IJ SOLN
INTRAMUSCULAR | Status: AC
  Filled 2023-11-29: qty 1

## 2023-11-29 MED ORDER — DEXAMETHASONE SODIUM PHOSPHATE 10 MG/ML IJ SOLN
INTRAMUSCULAR | Status: DC | PRN
  Administered 2023-11-29: 4 mg via INTRAVENOUS

## 2023-11-29 MED ORDER — BUPIVACAINE-EPINEPHRINE 0.25% -1:200000 IJ SOLN
INTRAMUSCULAR | Status: DC | PRN
Start: 2023-11-29 — End: 2023-11-29
  Administered 2023-11-29: 30 mL

## 2023-11-29 MED ORDER — OXYCODONE HCL 5 MG/5ML PO SOLN: 5.0000 mg | Freq: Once | ORAL | Status: DC | PRN

## 2023-11-29 MED ORDER — FENTANYL CITRATE PF 50 MCG/ML IJ SOSY
PREFILLED_SYRINGE | INTRAMUSCULAR | Status: AC
  Filled 2023-11-29: qty 2

## 2023-11-29 MED ORDER — SUGAMMADEX SODIUM 200 MG/2ML IV SOLN
INTRAVENOUS | Status: DC | PRN
  Administered 2023-11-29: 200 mg via INTRAVENOUS

## 2023-11-29 MED ORDER — FENTANYL CITRATE PF 50 MCG/ML IJ SOSY
25.0000 ug | PREFILLED_SYRINGE | INTRAMUSCULAR | Status: DC | PRN
  Administered 2023-11-29 (×3): 50 ug via INTRAVENOUS

## 2023-11-29 MED ORDER — LIDOCAINE HCL (CARDIAC) PF 100 MG/5ML IV SOSY
PREFILLED_SYRINGE | INTRAVENOUS | Status: DC | PRN
  Administered 2023-11-29: 60 mg via INTRAVENOUS

## 2023-11-29 MED ORDER — FENTANYL CITRATE (PF) 100 MCG/2ML IJ SOLN
INTRAMUSCULAR | Status: AC
  Filled 2023-11-29: qty 2

## 2023-11-29 MED ORDER — ENOXAPARIN SODIUM 40 MG/0.4ML IJ SOSY
40.0000 mg | PREFILLED_SYRINGE | Freq: Every day | INTRAMUSCULAR | Status: DC
  Administered 2023-11-30: 40 mg via SUBCUTANEOUS
  Filled 2023-11-29: qty 0.4

## 2023-11-29 MED ORDER — MIDAZOLAM HCL 2 MG/2ML IJ SOLN
INTRAMUSCULAR | Status: DC | PRN
  Administered 2023-11-29: 2 mg via INTRAVENOUS

## 2023-11-29 MED ORDER — 0.9 % SODIUM CHLORIDE (POUR BTL) OPTIME
TOPICAL | Status: DC | PRN
Start: 2023-11-29 — End: 2023-11-29
  Administered 2023-11-29: 1000 mL

## 2023-11-29 MED ORDER — ROCURONIUM BROMIDE 10 MG/ML (PF) SYRINGE
PREFILLED_SYRINGE | INTRAVENOUS | Status: AC
  Filled 2023-11-29: qty 10

## 2023-11-29 MED ORDER — ROCURONIUM BROMIDE 10 MG/ML (PF) SYRINGE
PREFILLED_SYRINGE | INTRAVENOUS | Status: DC | PRN
  Administered 2023-11-29: 50 mg via INTRAVENOUS

## 2023-11-29 MED ORDER — PHENYLEPHRINE 80 MCG/ML (10ML) SYRINGE FOR IV PUSH (FOR BLOOD PRESSURE SUPPORT)
PREFILLED_SYRINGE | INTRAVENOUS | Status: DC | PRN
  Administered 2023-11-29: 80 ug via INTRAVENOUS

## 2023-11-29 MED ORDER — LIDOCAINE HCL (PF) 2 % IJ SOLN
INTRAMUSCULAR | Status: AC
  Filled 2023-11-29: qty 5

## 2023-11-29 MED ORDER — CHLORHEXIDINE GLUCONATE 0.12 % MT SOLN
15.0000 mL | Freq: Once | OROMUCOSAL | Status: AC
  Administered 2023-11-29: 15 mL via OROMUCOSAL

## 2023-11-29 MED ORDER — FENTANYL CITRATE (PF) 100 MCG/2ML IJ SOLN
INTRAMUSCULAR | Status: DC | PRN
  Administered 2023-11-29 (×2): 50 ug via INTRAVENOUS

## 2023-11-29 SURGICAL SUPPLY — 34 items
BAG COUNTER SPONGE SURGICOUNT (BAG) IMPLANT
CABLE HIGH FREQUENCY MONO STRZ (ELECTRODE) ×1 IMPLANT
CATH URETL OPEN 5X70 (CATHETERS) IMPLANT
CHLORAPREP W/TINT 26 (MISCELLANEOUS) ×1 IMPLANT
CLIP APPLIE ROT 10 11.4 M/L (STAPLE) ×1 IMPLANT
COVER MAYO STAND XLG (MISCELLANEOUS) ×1 IMPLANT
COVER SURGICAL LIGHT HANDLE (MISCELLANEOUS) ×1 IMPLANT
DERMABOND ADVANCED .7 DNX12 (GAUZE/BANDAGES/DRESSINGS) ×1 IMPLANT
DRAPE C-ARM 42X120 X-RAY (DRAPES) IMPLANT
ELECT REM PT RETURN 15FT ADLT (MISCELLANEOUS) ×1 IMPLANT
ENDOLOOP SUT PDS II 0 18 (SUTURE) ×1 IMPLANT
GLOVE BIO SURGEON STRL SZ7.5 (GLOVE) ×1 IMPLANT
GLOVE INDICATOR 8.0 STRL GRN (GLOVE) ×1 IMPLANT
GOWN STRL REUS W/ TWL XL LVL3 (GOWN DISPOSABLE) ×1 IMPLANT
GRASPER SUT TROCAR 14GX15 (MISCELLANEOUS) IMPLANT
HEMOSTAT SNOW SURGICEL 2X4 (HEMOSTASIS) IMPLANT
IRRIGATION SUCT STRKRFLW 2 WTP (MISCELLANEOUS) ×1 IMPLANT
IV CATH 14GX2 1/4 (CATHETERS) ×1 IMPLANT
KIT BASIN OR (CUSTOM PROCEDURE TRAY) ×1 IMPLANT
KIT TURNOVER KIT A (KITS) ×1 IMPLANT
NDL INSUFFLATION 14GA 120MM (NEEDLE) ×1 IMPLANT
NEEDLE INSUFFLATION 14GA 120MM (NEEDLE) ×1 IMPLANT
POUCH RETRIEVAL ECOSAC 10 (ENDOMECHANICALS) ×1 IMPLANT
SCISSORS LAP 5X35 DISP (ENDOMECHANICALS) ×1 IMPLANT
SET TUBE SMOKE EVAC HIGH FLOW (TUBING) ×1 IMPLANT
SLEEVE Z-THREAD 5X100MM (TROCAR) ×2 IMPLANT
SPIKE FLUID TRANSFER (MISCELLANEOUS) ×1 IMPLANT
STOPCOCK 4 WAY LG BORE MALE ST (IV SETS) IMPLANT
SUT MNCRL AB 4-0 PS2 18 (SUTURE) ×1 IMPLANT
SUT VICRYL 0 UR6 27IN ABS (SUTURE) IMPLANT
TOWEL OR 17X26 10 PK STRL BLUE (TOWEL DISPOSABLE) ×1 IMPLANT
TRAY LAPAROSCOPIC (CUSTOM PROCEDURE TRAY) ×1 IMPLANT
TROCAR ADV FIXATION 12X100MM (TROCAR) ×1 IMPLANT
TROCAR Z-THREAD OPTICAL 5X100M (TROCAR) ×1 IMPLANT

## 2023-11-29 NOTE — Progress Notes (Signed)
 [facility]  PROGRESS NOTE  Donna Trevino FMW:[id number] DOB: [birth year] DOA: 11/27/2023 PCP: Patient, No Pcp Per   Brief HPI:    43 y.o. female with medical history significant of anxiety who presented to the emergency department complaints of abdominal pain for a week that worsens with food intake.  The pain is colicky in nature and radiates to her back  Abdominal ultrasound showed biliary duct dilatation with probable choledocholithiasis MRCP recommended. Gallbladder poorly evaluated possible cholelithiasis. Mild cirrhosis could not be excluded. 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 No renal vein involvement or metastatic disease. Biliary dilatation without calcified obstructive stone. MRCP recommende    Assessment/Plan:   Acute pancreatitis/biliary pancreatitis -Abdominal ultrasound showed biliary ductal dilatation with probable choledocholithiasis - MRCP abdomen showed numerous tiny stones in the mid and distal common bile duct as well as cystic duct.  Largest common bile duct stone measuring 4 mm -Lipase was elevated at 2800, Lipase is down to 93  -Gastroenterology consulted; underwent ERCP with biliary sphincterotomy and complete removal of biliary stones -General Surgery was consulted for cholecystectomy.     Active Problems:   Renal mass Seen by urology. -Referrals placed for Dr. Sherrilee or Dr. Francisca. -Urology, Dr. Carolee will follow as outpatient for nephrectomy     Abnormal LFTs In the setting of choledocholithiasis. LFTs are improving  Hypokalemia - Replete    Medications     enoxaparin  (LOVENOX ) injection  40 mg Subcutaneous Q24H   pantoprazole  (PROTONIX ) IV  40 mg Intravenous Q24H     Data Reviewed:   CBG:  No results for input(s): GLUCAP in the last 168 hours.  SpO2: 98 % O2 Flow Rate (L/min): 8 L/min    Vitals:   11/28/23 1700 11/28/23 1721  11/28/23 2008 11/29/23 0550  BP: (!) 155/93 (!) 143/96 (!) 152/90 117/80  Pulse: (!) 102 100 (!) 104 71  Resp: 15 15 18 18   Temp:  98.1 F (36.7 C) 98.2 F (36.8 C) 98.3 F (36.8 C)  TempSrc:  Oral Oral Oral  SpO2: 97% 100% 99% 98%  Weight:      Height:          Data Reviewed:  Basic Metabolic Panel: Recent Labs  Lab 11/27/23 0625 11/28/23 0446 11/29/23 0449  NA 136 135 136  K 3.7 3.3* 3.7  CL 103 105 103  CO2 27 23 24   GLUCOSE 103* 98 97  BUN 10 <5* 7  CREATININE 0.67 0.53 0.60  CALCIUM 9.0 8.1* 8.7*    CBC: Recent Labs  Lab 11/27/23 0625 11/28/23 0446 11/29/23 0449  WBC 9.9 7.5 7.1  NEUTROABS 7.6  --   --   HGB 12.9 11.7* 12.2  HCT 40.5 35.7* 36.7  MCV 98.8 98.3 97.9  PLT 280 262 279    LFT Recent Labs  Lab 11/27/23 0625 11/28/23 0446 11/29/23 0449  AST 170* 62* 93*  ALT 172* 131* 173*  ALKPHOS 167* 131* 173*  BILITOT 0.9 1.0 1.1  PROT 7.3 6.1* 7.0  ALBUMIN 4.0 3.3* 3.6     Antibiotics: Anti-infectives (From admission, onward)    None        DVT prophylaxis: Lovenox   Code Status: Full code  Family Communication: No family at bedside   CONSULTS gastroenterology   Subjective   Underwent ERCP yesterday.  Denies any pain.   Objective    Physical Examination:  General-appears in no acute distress Heart-S1-S2, regular, no murmur  auscultated Lungs-clear to auscultation bilaterally, no wheezing or crackles auscultated Abdomen-soft, nontender, no organomegaly Extremities-no edema in the lower extremities Neuro-alert, oriented x3, no focal deficit noted  Status is: Inpatient:             Donna Trevino   [facility] If 7PM-7AM, please contact night-coverage at [email or web address], Office  [phone]   11/29/2023, 8:30 AM  LOS: 1 day

## 2023-11-29 NOTE — Anesthesia Procedure Notes (Signed)
 Procedure Name: Intubation Date/Time: 11/29/2023 1:59 PM  Performed by: Brandy Almarie BROCKS, CRNAPre-anesthesia Checklist: Patient identified, Emergency Drugs available, Suction available and Patient being monitored Patient Re-evaluated:Patient Re-evaluated prior to induction Oxygen Delivery Method: Circle system utilized Preoxygenation: Pre-oxygenation with 100% oxygen Induction Type: IV induction Ventilation: Mask ventilation without difficulty Laryngoscope Size: Mac and 4 Grade View: Grade I Tube type: Oral Tube size: 7.0 mm Number of attempts: 1 Airway Equipment and Method: Stylet Placement Confirmation: ETT inserted through vocal cords under direct vision, positive ETCO2 and breath sounds checked- equal and bilateral Secured at: 22 cm Tube secured with: Tape Dental Injury: Teeth and Oropharynx as per pre-operative assessment

## 2023-11-29 NOTE — Progress Notes (Signed)
 [facility] Gastroenterology Progress Note  Donna Trevino 43 y.o. 1980/09/01   Subjective: Abdominal pain. Denies N/V. Sister says it feels like a menstrual cramp. Translator with Stratus machine when seen this morning. Daughters and sister in room.  Objective: Vital signs: Vitals:   11/28/23 2008 11/29/23 0550  BP: (!) 152/90 117/80  Pulse: (!) 104 71  Resp: 18 18  Temp: 98.2 F (36.8 C) 98.3 F (36.8 C)  SpO2: 99% 98%    Physical Exam: Gen: lethargic, no acute distress, thin, pleasant HEENT: anicteric sclera CV: RRR Chest: CTA B Abd: diffuse tenderness with guarding, soft, nondistended, +BS Ext: no edema  Lab Results: Recent Labs    11/28/23 0446 11/29/23 0449  NA 135 136  K 3.3* 3.7  CL 105 103  CO2 23 24  GLUCOSE 98 97  BUN <5* 7  CREATININE 0.53 0.60  CALCIUM 8.1* 8.7*   Recent Labs    11/28/23 0446 11/29/23 0449  AST 62* 93*  ALT 131* 173*  ALKPHOS 131* 173*  BILITOT 1.0 1.1  PROT 6.1* 7.0  ALBUMIN 3.3* 3.6   Recent Labs    11/27/23 0625 11/28/23 0446 11/29/23 0449  WBC 9.9 7.5 7.1  NEUTROABS 7.6  --   --   HGB 12.9 11.7* 12.2  HCT 40.5 35.7* 36.7  MCV 98.8 98.3 97.9  PLT 280 262 279   Lipase 66   Assessment/Plan: Gallstone pancreatitis with choledocholithiasis removed on ERCP yesterday. Doing ok post-ERCP. Lap chole today. Supportive care. Will follow.   [name] 11/29/2023, 12:54 PM  Questions please call [phone] ID: Donna Trevino, female   DOB: 05-18-1980, 43 y.o.   MRN: [id number]

## 2023-11-29 NOTE — Consult Note (Signed)
 Consulting Physician: Deward PARAS Ameira Alessandrini  Referring Provider: Dr. Drusilla  Chief Complaint: Abdominal pain  Reason for Consult: Gallstone pancreatitis   Subjective   HPI: Donna Trevino is an 43 y.o. female who is here for abdominal pain.  Found to have gallstone pancreatitis and underwent ERCP yesterday.  Now in need of cholecystectomy.  No pain this morning, anticipating surgery.  Wonders about the plan for the kidney mass.  Past Medical History:  Diagnosis Date   Anxiety    No pertinent past medical history     Past Surgical History:  Procedure Laterality Date   BREAST BIOPSY Right 05/23/2021   NO PAST SURGERIES      Family History  Problem Relation Age of Onset   Stomach cancer Father    Breast cancer Maternal Aunt    Breast cancer Maternal Aunt    Anesthesia problems Neg Hx     Social:  reports that she has never smoked. She has never used smokeless tobacco. She reports that she does not drink alcohol and does not use drugs.  Allergies:  Allergies  Allergen Reactions   Penicillins Nausea And Vomiting and Other (See Comments)    Dizziness, headache, nausea    Medications: Current Outpatient Medications  Medication Instructions   acetaminophen  (TYLENOL ) 875 mg, Every 6 hours PRN   cholecalciferol (VITAMIN D3) 1,000 Units, Daily   hyoscyamine  (LEVSIN ) 0.125 mg, Every 8 hours PRN   Multiple Vitamin (MULTIVITAMIN) tablet 1 tablet, Daily   vitamin B-12 (CYANOCOBALAMIN) 50 mcg, Daily    ROS - all of the below systems have been reviewed with the patient and positives are indicated with bold text General: chills, fever or night sweats Eyes: blurry vision or double vision ENT: epistaxis or sore throat Allergy/Immunology: itchy/watery eyes or nasal congestion Hematologic/Lymphatic: bleeding problems, blood clots or swollen lymph nodes Endocrine: temperature intolerance or unexpected weight changes Breast: new or changing breast lumps or nipple  discharge Resp: cough, shortness of breath, or wheezing CV: chest pain or dyspnea on exertion GI: as per HPI GU: dysuria, trouble voiding, or hematuria MSK: joint pain or joint stiffness Neuro: TIA or stroke symptoms Derm: pruritus and skin lesion changes Psych: anxiety and depression  Objective   PE Blood pressure 117/80, pulse 71, temperature 98.3 F (36.8 C), temperature source Oral, resp. rate 18, height 5' 6 (1.676 m), weight 55.4 kg, SpO2 98%. Constitutional: NAD; conversant; no deformities Eyes: Moist conjunctiva; no lid lag; anicteric; PERRL Neck: Trachea midline; no thyromegaly Lungs: Normal respiratory effort; no tactile fremitus CV: RRR; no palpable thrills; no pitting edema GI: Abd Soft, nontender; no palpable hepatosplenomegaly MSK: Normal range of motion of extremities; no clubbing/cyanosis Psychiatric: Appropriate affect; alert and oriented x3 Lymphatic: No palpable cervical or axillary lymphadenopathy  Results for orders placed or performed during the hospital encounter of 11/27/23 (from the past 24 hours)  Lipase, blood     Status: Abnormal   Collection Time: 11/29/23  4:49 AM  Result Value Ref Range   Lipase 66 (H) 11 - 51 U/L  CBC     Status: Abnormal   Collection Time: 11/29/23  4:49 AM  Result Value Ref Range   WBC 7.1 4.0 - 10.5 K/uL   RBC 3.75 (L) 3.87 - 5.11 MIL/uL   Hemoglobin 12.2 12.0 - 15.0 g/dL   HCT 63.2 63.9 - 53.9 %   MCV 97.9 80.0 - 100.0 fL   MCH 32.5 26.0 - 34.0 pg   MCHC 33.2 30.0 - 36.0 g/dL  RDW 11.9 11.5 - 15.5 %   Platelets 279 150 - 400 K/uL   nRBC 0.0 0.0 - 0.2 %  Comprehensive metabolic panel     Status: Abnormal   Collection Time: 11/29/23  4:49 AM  Result Value Ref Range   Sodium 136 135 - 145 mmol/L   Potassium 3.7 3.5 - 5.1 mmol/L   Chloride 103 98 - 111 mmol/L   CO2 24 22 - 32 mmol/L   Glucose, Bld 97 70 - 99 mg/dL   BUN 7 6 - 20 mg/dL   Creatinine, Ser 9.39 0.44 - 1.00 mg/dL   Calcium 8.7 (L) 8.9 - 10.3 mg/dL    Total Protein 7.0 6.5 - 8.1 g/dL   Albumin 3.6 3.5 - 5.0 g/dL   AST 93 (H) 15 - 41 U/L   ALT 173 (H) 0 - 44 U/L   Alkaline Phosphatase 173 (H) 38 - 126 U/L   Total Bilirubin 1.1 0.0 - 1.2 mg/dL   GFR, Estimated >39 >39 mL/min   Anion gap 9 5 - 15     Imaging Orders         CT ABDOMEN PELVIS W CONTRAST         US  Abdomen Limited         MR ABDOMEN MRCP W WO CONTAST         MR 3D Recon At Scanner         CT CHEST WO CONTRAST         DG ERCP      Assessment and Plan   Donna Trevino is an 43 y.o. female with gallstone pancreatitis, now resolved, now s/p ERCP without signs of complication.  I recommended laparoscopic cholecystectomy.  We discussed the procedure, its risks, benefits and alternatives.  She wonders if the kidney cancer could be addressed simultaneously.  I will discuss with urology, but I think it may be best to do the surgeries separately.   Deward JINNY Foy, MD  [facility] Use [email or web address] to contact on call provider  New Patient Billing: [phone] - High MDM

## 2023-11-29 NOTE — Anesthesia Preprocedure Evaluation (Addendum)
 Anesthesia Evaluation  Patient identified by MRN, date of birth, ID band Patient awake    Reviewed: Allergy & Precautions, NPO status , Patient's Chart, lab work & pertinent test results  History of Anesthesia Complications Negative for: history of anesthetic complications  Airway Mallampati: II  TM Distance: >3 FB Neck ROM: Full    Dental no notable dental hx.    Pulmonary neg pulmonary ROS   Pulmonary exam normal        Cardiovascular negative cardio ROS Normal cardiovascular exam     Neuro/Psych   Anxiety     negative neurological ROS     GI/Hepatic negative GI ROS, Neg liver ROS,,,  Endo/Other  negative endocrine ROS    Renal/GU negative Renal ROS     Musculoskeletal negative musculoskeletal ROS (+)    Abdominal   Peds  Hematology negative hematology ROS (+)   Anesthesia Other Findings Day of surgery medications reviewed with patient.  Reproductive/Obstetrics                              Anesthesia Physical Anesthesia Plan  ASA: 2  Anesthesia Plan: General   Post-op Pain Management: Tylenol  PO (pre-op)*   Induction: Intravenous  PONV Risk Score and Plan: 3 and Treatment may vary due to age or medical condition, Ondansetron , Dexamethasone , Midazolam  and Scopolamine  patch - Pre-op  Airway Management Planned: Oral ETT  Additional Equipment: None  Intra-op Plan:   Post-operative Plan: Extubation in OR  Informed Consent: I have reviewed the patients History and Physical, chart, labs and discussed the procedure including the risks, benefits and alternatives for the proposed anesthesia with the patient or authorized representative who has indicated his/her understanding and acceptance.     Dental advisory given  Plan Discussed with: CRNA  Anesthesia Plan Comments:          Anesthesia Quick Evaluation

## 2023-11-29 NOTE — Plan of Care (Signed)

## 2023-11-29 NOTE — Anesthesia Postprocedure Evaluation (Signed)
 Anesthesia Post Note  Patient: International aid/development worker  Procedure(s) Performed: LAPAROSCOPIC CHOLECYSTECTOMY     Patient location during evaluation: PACU Anesthesia Type: General Level of consciousness: awake and alert Pain management: pain level controlled Vital Signs Assessment: post-procedure vital signs reviewed and stable Respiratory status: spontaneous breathing, nonlabored ventilation and respiratory function stable Cardiovascular status: blood pressure returned to baseline Postop Assessment: no apparent nausea or vomiting Anesthetic complications: no   No notable events documented.  Last Vitals:  Vitals:   11/29/23 1545 11/29/23 1625  BP: 139/84 (!) 141/96  Pulse: 76 82  Resp: 16 14  Temp:  36.7 C  SpO2: 99% 100%                  Vertell Row

## 2023-11-29 NOTE — Op Note (Signed)
 Patient: Donna Trevino (Oct 08, 1980, [id number])  Date of Surgery: 11/29/2023  Preoperative Diagnosis: CHOLEDOCHOLITHIASIS   Postoperative Diagnosis: CHOLEDOCHOLITHIASIS   Surgical Procedure: LAPAROSCOPIC CHOLECYSTECTOMY:     Operative Team Members:  Surgeons and Role:    * Derryck Shahan, Deward PARAS, MD - Primary   Anesthesiologist: Lashawnna Lambrecht Lamarr BRAVO, MD CRNA: Brandy Almarie BROCKS, CRNA   Anesthesia: General   Fluids:  Total I/O In: 100 [IV Piggyback:100] Out: -   Complications: * No complications entered in OR log *  Drains:  none   Specimen:  ID Type Source Tests Collected by Time Destination  1 : gallbladder Tissue PATH Gallbladder SURGICAL PATHOLOGY Ilda Laskin, Deward PARAS, MD 11/29/2023 1423      Disposition:  PACU - hemodynamically stable.  Plan of Care: Okay for discharge    Indications for Procedure: Donna Trevino is a 43 y.o. female who presented with abdominal pain.  History, physical and imaging was concerning for choledocholithiasis.  ERCP was completed 7/16 without complication.  Laparoscopic cholecystectomy was recommended for the patient.  The procedure itself, as well as the risks, benefits and alternatives were discussed with the patient.  Risks discussed included but were not limited to the risk of infection, bleeding, damage to nearby structures, need to convert to open procedure, incisional hernia, bile leak, common bile duct injury and the need for additional procedures or surgeries.  With this discussion complete and all questions answered the patient granted consent to proceed.  Findings: Gallstones  Infection status: Patient: Donna Trevino Emergency General Surgery Service Patient Case: Urgent Infection Present At Time Of Surgery (PATOS): None   Description of Procedure:   On the date stated above, the patient was taken to the operating room suite and placed in supine positioning.  Sequential compression devices were placed on the  lower extremities to prevent blood clots.  General endotracheal anesthesia was induced. Preoperative antibiotics were given.  The patient's abdomen was prepped and draped in the usual sterile fashion.  A time-out was completed verifying the correct patient, procedure, positioning and equipment needed for the case.  We began by anesthetizing the skin with local anesthetic and then making a 5 mm incision just below the umbilicus.  We dissected through the subcutaneous tissues to the fascia.  The fascia was grasped and elevated using a Kocher clamp.  A Veress needle was inserted into the abdomen and the abdomen was insufflated to 15 mmHg.  A 5 mm trocar was inserted in this position under optical guidance and then the abdomen was inspected.  There was no trauma to the underlying viscera with initial trocar placement.  Any abnormal findings, other than inflammation in the right upper quadrant, are listed above in the findings section.  Three additional trocars were placed, one 12 mm trocar in the subxiphoid position, one 5 mm trocar in the midline epigastric area and one 5mm trocar in the right upper quadrant subcostally.  These were placed under direct vision without any trauma to the underlying viscera.    The patient was then placed in head up, left side down positioning.  The gallbladder was identified and dissected free from its attachments to the omentum allowing the duodenum to fall away.  The infundibulum of the gallbladder was dissected free working laterally to medially.  The cystic duct and cystic artery were dissected free from surrounding connective tissue.  The infundibulum of the gallbladder was dissected off the cystic plate.  A critical view of safety was obtained with the cystic duct and cystic artery  being cleared of connective tissues and clearly the only two structures entering into the gallbladder with the liver clearly visible behind.  Clips were then applied to the cystic duct and cystic  artery and then these structures were divided.  A PDS Endoloop was placed on the cystic duct stump. The gallbladder was dissected off the cystic plate, placed in an endocatch bag and removed from the 12 mm subxiphoid port site.  The clips were inspected and appeared effective.  The cystic plate was inspected and hemostasis was obtained using electrocautery.  A suction irrigator was used to clean the operative field.  Attention was turned to closure.  The 12 mm subxiphoid port site was closed using a 0-vicryl suture on a fascial suture passer.  The abdomen was desufflated.  The skin was closed using 4-0 monocryl and dermabond.  All sponge and needle counts were correct at the conclusion of the case.    Deward Foy, MD General, Bariatric, & Minimally Invasive Surgery [facility], [place]

## 2023-11-29 NOTE — Transfer of Care (Signed)
 Immediate Anesthesia Transfer of Care Note  Patient: International aid/development worker  Procedure(s) Performed: LAPAROSCOPIC CHOLECYSTECTOMY  Patient Location: PACU  Anesthesia Type:General  Level of Consciousness: awake, alert , and oriented  Airway & Oxygen Therapy: Patient Spontanous Breathing and Patient connected to face mask oxygen  Post-op Assessment: Report given to RN, Post -op Vital signs reviewed and stable, and Patient moving all extremities X 4  Post vital signs: Reviewed and stable  Last Vitals:  Vitals Value Taken Time  BP 144/86 11/29/23 14:52  Temp    Pulse 91 11/29/23 14:52  Resp 21 11/29/23 14:52  SpO2 98 % 11/29/23 14:52  Vitals shown include unfiled device data.  Last Pain:  Vitals:   11/29/23 1331  TempSrc:   PainSc: 4       Patients Stated Pain Goal: 5 (Intermittent) (11/29/23 1312)  Complications: No notable events documented.

## 2023-11-30 ENCOUNTER — Encounter (HOSPITAL_COMMUNITY): Payer: Self-pay | Admitting: Surgery

## 2023-11-30 LAB — COMPREHENSIVE METABOLIC PANEL WITH GFR
ALT: 115 U/L — ABNORMAL HIGH (ref 0–44)
AST: 48 U/L — ABNORMAL HIGH (ref 15–41)
Albumin: 3.7 g/dL (ref 3.5–5.0)
Alkaline Phosphatase: 138 U/L — ABNORMAL HIGH (ref 38–126)
Anion gap: 12 (ref 5–15)
BUN: 11 mg/dL (ref 6–20)
CO2: 24 mmol/L (ref 22–32)
Calcium: 8.8 mg/dL — ABNORMAL LOW (ref 8.9–10.3)
Chloride: 99 mmol/L (ref 98–111)
Creatinine, Ser: 0.34 mg/dL — ABNORMAL LOW (ref 0.44–1.00)
GFR, Estimated: >60 mL/min (ref >60)
Glucose, Bld: 87 mg/dL (ref 70–99)
Potassium: 3.3 mmol/L — ABNORMAL LOW (ref 3.5–5.1)
Sodium: 135 mmol/L (ref 135–145)
Total Bilirubin: 1.2 mg/dL (ref 0.0–1.2)
Total Protein: 6.8 g/dL (ref 6.5–8.1)

## 2023-11-30 MED ORDER — POTASSIUM CHLORIDE CRYS ER 20 MEQ PO TBCR
40.0000 meq | EXTENDED_RELEASE_TABLET | Freq: Once | ORAL | Status: AC
  Administered 2023-11-30: 40 meq via ORAL
  Filled 2023-11-30: qty 2

## 2023-11-30 MED ORDER — PANTOPRAZOLE SODIUM 40 MG PO TBEC
40.0000 mg | DELAYED_RELEASE_TABLET | Freq: Every day | ORAL | Status: DC
  Administered 2023-12-01: 40 mg via ORAL
  Filled 2023-11-30: qty 1

## 2023-11-30 MED ORDER — METHOCARBAMOL 500 MG PO TABS
500.0000 mg | ORAL_TABLET | Freq: Three times a day (TID) | ORAL | Status: DC
  Administered 2023-11-30 – 2023-12-01 (×4): 500 mg via ORAL
  Filled 2023-11-30 (×4): qty 1

## 2023-11-30 NOTE — Progress Notes (Signed)
 1 Day Post-Op  Subjective: Patient having a fair amount of pain today in her epigastrium.  Mild nausea.  Has drank some liquids, but otherwise not taken in much.  More pain with movement and breathing, but still with a Kpad on laying still in the bed  Objective: Vital signs in last 24 hours: Temp:  [97.8 F (36.6 C)-99.2 F (37.3 C)] 98.1 F (36.7 C) (07/18 0622) Pulse Rate:  [72-95] 81 (07/18 0622) Resp:  [13-20] 19 (07/18 0622) BP: (120-146)/(80-96) 124/84 (07/18 0622) SpO2:  [97 %-100 %] 97 % (07/18 0622) Weight:  [55.4 kg] 55.4 kg (07/17 1312) Last BM Date : 11/26/23  Intake/Output from previous day: 07/17 0701 - 07/18 0700 In: 700 [I.V.:600; IV Piggyback:100] Out: 710 [Urine:700; Blood:10] Intake/Output this shift: No intake/output data recorded.  PE: Gen: appears to be uncomfortable Abd: soft, tender as expected, but diffusely, greatest in epigastrium, incisions c/d/I, ND  Lab Results:  Recent Labs    11/28/23 0446 11/29/23 0449  WBC 7.5 7.1  HGB 11.7* 12.2  HCT 35.7* 36.7  PLT 262 279   BMET Recent Labs    11/28/23 0446 11/29/23 0449  NA 135 136  K 3.3* 3.7  CL 105 103  CO2 23 24  GLUCOSE 98 97  BUN <5* 7  CREATININE 0.53 0.60  CALCIUM 8.1* 8.7*   PT/INR No results for input(s): LABPROT, INR in the last 72 hours. CMP     Component Value Date/Time   NA 136 11/29/2023 0449   K 3.7 11/29/2023 0449   CL 103 11/29/2023 0449   CO2 24 11/29/2023 0449   GLUCOSE 97 11/29/2023 0449   BUN 7 11/29/2023 0449   CREATININE 0.60 11/29/2023 0449   CALCIUM 8.7 (L) 11/29/2023 0449   PROT 7.0 11/29/2023 0449   ALBUMIN 3.6 11/29/2023 0449   AST 93 (H) 11/29/2023 0449   ALT 173 (H) 11/29/2023 0449   ALKPHOS 173 (H) 11/29/2023 0449   BILITOT 1.1 11/29/2023 0449   GFRNONAA >60 11/29/2023 0449   Lipase     Component Value Date/Time   LIPASE 66 (H) 11/29/2023 0449       Studies/Results: DG ERCP Result Date: 11/29/2023 CLINICAL DATA:  [id number]  Surgery, elective [id number], pancreatitis, choledocholithiasis EXAM: ERCP TECHNIQUE: Multiple spot images obtained with the fluoroscopic device and submitted for interpretation post-procedure. COMPARISON:  MRCP 11/27/2023 FINDINGS: A series of fluoroscopic spot images document endoscopic cannulation and opacification of the CBD with passage of balloon catheter. IMPRESSION: Endoscopic CBD cannulation and intervention as above. These images were submitted for radiologic interpretation only. Please see the procedural report for the amount of contrast and the fluoroscopy time utilized. Electronically Signed   By: JONETTA Faes M.D.   On: 11/29/2023 09:50    Anti-infectives: Anti-infectives (From admission, onward)    Start     Dose/Rate Route Frequency Ordered Stop   11/29/23 1400  ceFAZolin  (ANCEF ) IVPB 2g/100 mL premix        2 g 200 mL/hr over 30 Minutes Intravenous  Once 11/29/23 1346 11/29/23 1430        Assessment/Plan POD 1, s/p lap chole by Dr. Lyndel 7/17, for gallstone pancreatitis with CBD stone, s/p ERCP -patient uncomfortable today.  It sounds like a lot of her pain is more post op with movement, etc; however, she is having some deeper pain in her epigastrium that still may be related to her pancreatitis -cont her diet as she is able to tolerate -will add robaxin to  help with spasm and see if this helps as well -needs to get up and mobilize, pulm toilet -family at bedside and discussed with them.  Daughter was used as interpreter per patient request. -cont to monitor  FEN - regular VTE - lovenox  ID - no further abx needed  Kidney mass - urology follow up as outpatient   LOS: 2 days    Burnard FORBES Banter , [facility] 11/30/2023, 9:16 AM Please see Amion for pager number during day hours 7:00am-4:30pm or 7:00am -11:30am on weekends

## 2023-11-30 NOTE — Progress Notes (Signed)
 [facility] Gastroenterology Progress Note  Donna Trevino 43 y.o. 1981/05/15   Subjective: Abdominal pain diffusely.  Status post lap chole yesterday.  Family in room.  Objective: Vital signs: Vitals:   11/30/23 0330 11/30/23 0622  BP: 120/82 124/84  Pulse: 94 81  Resp: 17 19  Temp: 98 F (36.7 C) 98.1 F (36.7 C)  SpO2: 97% 97%    Physical Exam: Gen: Lethargic, mild acute distress, well-nourished   HEENT: anicteric sclera CV: RRR Chest: CTA B Abd: Diffuse tenderness with guarding, soft, nondistended, positive bowel sounds Ext: no edema  Lab Results: Recent Labs    11/28/23 0446 11/29/23 0449  NA 135 136  K 3.3* 3.7  CL 105 103  CO2 23 24  GLUCOSE 98 97  BUN <5* 7  CREATININE 0.53 0.60  CALCIUM 8.1* 8.7*   Recent Labs    11/28/23 0446 11/29/23 0449  AST 62* 93*  ALT 131* 173*  ALKPHOS 131* 173*  BILITOT 1.0 1.1  PROT 6.1* 7.0  ALBUMIN 3.3* 3.6   Recent Labs    11/28/23 0446 11/29/23 0449  WBC 7.5 7.1  HGB 11.7* 12.2  HCT 35.7* 36.7  MCV 98.3 97.9  PLT 262 279      Assessment/Plan: Gallstone pancreatitis- s/p ERCP with removal of choledocholithiasis and sphincterotomy preop and status post lap chole yesterday.  Postoperative pain.  Doubt related to ERCP.  Surgery managing pain and diet recs.  Supportive care. Dr. Kriss will f/u tomorrow.   Donna Trevino 11/30/2023, 9:09 AM  Questions please call [phone] ID: Donna Trevino, female   DOB: 11-Oct-1980, 43 y.o.   MRN: [id number]

## 2023-11-30 NOTE — Progress Notes (Signed)
 PHARMACIST - PHYSICIAN COMMUNICATION  DR:   Drusilla  CONCERNING: IV to Oral Route Change Policy  RECOMMENDATION: This patient is receiving pantoprazole  by the intravenous route.  Based on criteria approved by the Pharmacy and Therapeutics Committee, the intravenous medication(s) is/are being converted to the equivalent oral dose form(s).   DESCRIPTION: These criteria include: The patient is eating (either orally or via tube) and/or has been taking other orally administered medications for a least 24 hours The patient has no evidence of active gastrointestinal bleeding or impaired GI absorption (gastrectomy, short bowel, patient on TNA or NPO).  If you have questions about this conversion, please contact the Pharmacy Department  []   [phone] )  [facility] []   [phone] )  [facility] []   [phone] )  [facility] []   [phone] )  [facility] [x]   [phone] )  [facility]   [place], [facility] 11/30/2023 12:35 PM

## 2023-11-30 NOTE — Progress Notes (Signed)
 [facility]  PROGRESS NOTE  Darlis Wragg FMW:[id number] DOB: [birth year] DOA: 11/27/2023 PCP: Patient, No Pcp Per   Brief HPI:    43 y.o. female with medical history significant of anxiety who presented to the emergency department complaints of abdominal pain for a week that worsens with food intake.  The pain is colicky in nature and radiates to her back  Abdominal ultrasound showed biliary duct dilatation with probable choledocholithiasis MRCP recommended. Gallbladder poorly evaluated possible cholelithiasis. Mild cirrhosis could not be excluded. 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 No renal vein involvement or metastatic disease. Biliary dilatation without calcified obstructive stone. MRCP recommende    Assessment/Plan:   Acute pancreatitis/biliary pancreatitis; status postcholecystectomy -Abdominal ultrasound showed biliary ductal dilatation with probable choledocholithiasis - MRCP abdomen showed numerous tiny stones in the mid and distal common bile duct as well as cystic duct.  Largest common bile duct stone measuring 4 mm -Lipase was elevated at 2800, Lipase is down to 93  -Gastroenterology consulted; underwent ERCP with biliary sphincterotomy and complete removal of biliary stones -General Surgery was consulted and patient underwent cholecystectomy     Active Problems:   Renal mass Seen by urology. -Referrals placed for Dr. Sherrilee or Dr. Francisca. -Urology, Dr. Carolee will follow as outpatient for nephrectomy     Abnormal LFTs In the setting of choledocholithiasis. LFTs are improving  Hypokalemia - Replete  Epigastric pain - Continues to have significant epigastric pain - Continue pain management -Likely in setting of above - Likely discharge home in a.m.  Medications     acetaminophen   1,000 mg Oral Q6H   enoxaparin  (LOVENOX ) injection  40 mg Subcutaneous Q1400    methocarbamol  500 mg Oral TID   [START ON 12/01/2023] pantoprazole   40 mg Oral Daily     Data Reviewed:   CBG:  No results for input(s): GLUCAP in the last 168 hours.  SpO2: 99 % O2 Flow Rate (L/min): 3 L/min    Vitals:   11/30/23 0330 11/30/23 0622 11/30/23 0931 11/30/23 1436  BP: 120/82 124/84 (!) 133/91 (!) 140/84  Pulse: 94 81 78 80  Resp: 17 19 18 16   Temp: 98 F (36.7 C) 98.1 F (36.7 C) 98.2 F (36.8 C) 98.3 F (36.8 C)  TempSrc: Oral Oral Oral Oral  SpO2: 97% 97% 96% 99%  Weight:      Height:          Data Reviewed:  Basic Metabolic Panel: Recent Labs  Lab 11/27/23 0625 11/28/23 0446 11/29/23 0449 11/30/23 0907  NA 136 135 136 135  K 3.7 3.3* 3.7 3.3*  CL 103 105 103 99  CO2 27 23 24 24   GLUCOSE 103* 98 97 87  BUN 10 <5* 7 11  CREATININE 0.67 0.53 0.60 0.34*  CALCIUM 9.0 8.1* 8.7* 8.8*    CBC: Recent Labs  Lab 11/27/23 0625 11/28/23 0446 11/29/23 0449  WBC 9.9 7.5 7.1  NEUTROABS 7.6  --   --   HGB 12.9 11.7* 12.2  HCT 40.5 35.7* 36.7  MCV 98.8 98.3 97.9  PLT 280 262 279    LFT Recent Labs  Lab 11/27/23 0625 11/28/23 0446 11/29/23 0449 11/30/23 0907  AST 170* 62* 93* 48*  ALT 172* 131* 173* 115*  ALKPHOS 167* 131* 173* 138*  BILITOT 0.9 1.0 1.1 1.2  PROT 7.3 6.1* 7.0 6.8  ALBUMIN 4.0 3.3* 3.6 3.7     Antibiotics: Anti-infectives (From  admission, onward)    Start     Dose/Rate Route Frequency Ordered Stop   11/29/23 1400  ceFAZolin  (ANCEF ) IVPB 2g/100 mL premix        2 g 200 mL/hr over 30 Minutes Intravenous  Once 11/29/23 1346 11/29/23 1430        DVT prophylaxis: Lovenox   Code Status: Full code  Family Communication: No family at bedside   CONSULTS gastroenterology   Subjective   Patient seen and examined, complains of epigastric pain.   Objective    Physical Examination:  General-appears in no acute distress Heart-S1-S2, regular, no murmur auscultated Lungs-clear to auscultation bilaterally,  no wheezing or crackles auscultated Abdomen-soft, positive tenderness in epigastric region Extremities-no edema in the lower extremities Neuro-alert, oriented x3, no focal deficit noted  Status is: Inpatient:             Sabas GORMAN Brod   [facility] If 7PM-7AM, please contact night-coverage at [email or web address], Office  [phone]   11/30/2023, 3:51 PM  LOS: 2 days

## 2023-11-30 NOTE — Plan of Care (Signed)
 Saw that pt was concerned about plan for nephrectomy in general surgery's documentation. Reviewed case and plan with her, her daughter, and family friend. All questions have been answered to their satisfaction. Provided contact info for [facility], [place], and [facility]. This was also entered into her AVS

## 2023-11-30 NOTE — TOC Initial Note (Signed)
 Transition of Care [facility]) - Initial/Assessment Note    Patient Details  Name: Donna Trevino MRN: [id number] Date of Birth: Feb 28, 1981  Transition of Care [facility]) CM/SW Contact:    Donna Manuella Quill, RN Phone Number: 11/30/2023, 3:30 PM  Clinical Narrative:                 No insurance/PCP listed; spoke w/ pt and family in room; dtr/POC Donna [phone]) acted as Nurse, learning disability per [employer] request; pt says she lives at home w/ her son and dtr; she plans to return at d/c; family will provide transportation; pt verified she does not have insurance/PCP; she denied SDOH risks; she does not have DME, HH services, or home oxygen; pt/dtr agreed to receive resources for social services, and [facility]; they verbalized understanding pt/family will make appt w/ agencies of choice; resources placed in d/c instructions; copy of resources also given to pt's dtr; also pt says she works and she should be able to pay for her meds; pt verified pharmacy [facility] [address] [place]; TOC is following.  Expected Discharge Plan: Home/Self Care Barriers to Discharge: Continued Medical Work up   Patient Goals and CMS Choice Patient states their goals for this hospitalization and ongoing recovery are:: home          Expected Discharge Plan and Services   Discharge Planning Services: CM Consult   Living arrangements for the past 2 months: Single Family Home                                      Prior Living Arrangements/Services Living arrangements for the past 2 months: Single Family Home Lives with:: Relatives Patient language and need for interpreter reviewed:: Yes Do you feel safe going back to the place where you live?: Yes      Need for Family Participation in Patient Care: Yes (Comment) Care giver support system in place?: Yes (comment) Current home services:  (n/a) Criminal Activity/Legal Involvement Pertinent to Current Situation/Hospitalization: No -  Comment as needed  Activities of Daily Living   ADL Screening (condition at time of admission) Independently performs ADLs?: Yes (appropriate for developmental age) Is the patient deaf or have difficulty hearing?: No Does the patient have difficulty seeing, even when wearing glasses/contacts?: No Does the patient have difficulty concentrating, remembering, or making decisions?: No  Permission Sought/Granted Permission sought to share information with : Case Manager Permission granted to share information with : Yes, Verbal Permission Granted  Share Information with NAME: Case Manager     Permission granted to share info w Relationship: Donna Trevino (dtr) [id number]     Emotional Assessment Appearance:: Appears stated age Attitude/Demeanor/Rapport: Gracious Affect (typically observed): Accepting Orientation: : Oriented to Self, Oriented to Place, Oriented to  Time, Oriented to Situation Alcohol / Substance Use: Not Applicable Psych Involvement: No (comment)  Admission diagnosis:  Acute pancreatitis [K85.90] Left renal mass [N28.89] Acute pancreatitis without infection or necrosis, unspecified pancreatitis type [K85.90] Patient Active Problem List   Diagnosis Date Noted   Acute pancreatitis 11/27/2023   Renal mass 11/27/2023   Abnormal LFTs 11/27/2023   Constipation 03/02/2021   Costochondritis 01/04/2021   Syncope and collapse 01/04/2021   Normal delivery 06/15/2011   PCP:  Patient, No Pcp Per Pharmacy:   [facility] [phone] - [place], [place] - [address]  [place] [address] Phone: [phone] Fax: [phone]  [place] [facility] [address] [place] [address] Phone: [phone] Fax: [phone]     Social Drivers of Health (SDOH) Social History: SDOH Screenings   Food Insecurity: No Food Insecurity (11/30/2023)  Housing: Low Risk  (11/30/2023)   Transportation Needs: No Transportation Needs (11/30/2023)  Utilities: Not At Risk (11/30/2023)  Tobacco Use: Low Risk  (11/29/2023)   SDOH Interventions: Food Insecurity Interventions: Intervention Not Indicated, Inpatient TOC Housing Interventions: Intervention Not Indicated, Inpatient TOC Transportation Interventions: Intervention Not Indicated, Inpatient TOC Utilities Interventions: Intervention Not Indicated, Inpatient TOC   Readmission Risk Interventions     No data to display

## 2023-12-01 ENCOUNTER — Other Ambulatory Visit (HOSPITAL_COMMUNITY): Payer: Self-pay

## 2023-12-01 LAB — CBC WITH DIFFERENTIAL/PLATELET
Abs Immature Granulocytes: 0.07 K/uL (ref 0.00–0.07)
Basophils Absolute: 0 K/uL (ref 0.0–0.1)
Basophils Relative: 0 %
Eosinophils Absolute: 0.2 K/uL (ref 0.0–0.5)
Eosinophils Relative: 2 %
HCT: 37.7 % (ref 36.0–46.0)
Hemoglobin: 12.2 g/dL (ref 12.0–15.0)
Immature Granulocytes: 1 %
Lymphocytes Relative: 12 %
Lymphs Abs: 1.3 K/uL (ref 0.7–4.0)
MCH: 32 pg (ref 26.0–34.0)
MCHC: 32.4 g/dL (ref 30.0–36.0)
MCV: 99 fL (ref 80.0–100.0)
Monocytes Absolute: 1 K/uL (ref 0.1–1.0)
Monocytes Relative: 9 %
Neutro Abs: 7.9 K/uL — ABNORMAL HIGH (ref 1.7–7.7)
Neutrophils Relative %: 76 %
Platelets: 287 K/uL (ref 150–400)
RBC: 3.81 MIL/uL — ABNORMAL LOW (ref 3.87–5.11)
RDW: 11.9 % (ref 11.5–15.5)
WBC: 10.3 K/uL (ref 4.0–10.5)
nRBC: 0 % (ref 0.0–0.2)

## 2023-12-01 LAB — COMPREHENSIVE METABOLIC PANEL WITH GFR
ALT: 87 U/L — ABNORMAL HIGH (ref 0–44)
AST: 35 U/L (ref 15–41)
Albumin: 3.7 g/dL (ref 3.5–5.0)
Alkaline Phosphatase: 125 U/L (ref 38–126)
Anion gap: 9 (ref 5–15)
BUN: 11 mg/dL (ref 6–20)
CO2: 26 mmol/L (ref 22–32)
Calcium: 8.8 mg/dL — ABNORMAL LOW (ref 8.9–10.3)
Chloride: 101 mmol/L (ref 98–111)
Creatinine, Ser: 0.59 mg/dL (ref 0.44–1.00)
GFR, Estimated: >60 mL/min (ref >60)
Glucose, Bld: 92 mg/dL (ref 70–99)
Potassium: 3.7 mmol/L (ref 3.5–5.1)
Sodium: 136 mmol/L (ref 135–145)
Total Bilirubin: 0.9 mg/dL (ref 0.0–1.2)
Total Protein: 6.8 g/dL (ref 6.5–8.1)

## 2023-12-01 MED ORDER — OXYCODONE-ACETAMINOPHEN 5-325 MG PO TABS
1.0000 | ORAL_TABLET | ORAL | 0 refills | Status: DC | PRN
  Filled 2023-12-01: qty 20, 4d supply, fill #0

## 2023-12-01 NOTE — Discharge Summary (Signed)
 Physician Discharge Summary   Patient: Donna Trevino MRN: [id number] DOB: 1980-11-23  Admit date:     11/27/2023  Discharge date: 12/01/23  Discharge Physician: Sabas GORMAN Brod   PCP: Patient, No Pcp Per   Recommendations at discharge:   [facility]  Discharge Diagnoses: Principal Problem:   Acute pancreatitis Active Problems:   Renal mass   Abnormal LFTs  Resolved Problems:   * No resolved hospital problems. *  Hospital Course: 43 y.o. female with medical history significant of anxiety who presented to the emergency department complaints of abdominal pain for a week that worsens with food intake.  The pain is colicky in nature and radiates to her back  Abdominal ultrasound showed biliary duct dilatation with probable choledocholithiasis MRCP recommended. Gallbladder poorly evaluated possible cholelithiasis. Mild cirrhosis could not be excluded. 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 No renal vein involvement or metastatic disease. Biliary dilatation without calcified obstructive stone. MRCP recommended  Assessment and Plan:  Acute pancreatitis/biliary pancreatitis; status postcholecystectomy -Abdominal ultrasound showed biliary ductal dilatation with probable choledocholithiasis - MRCP abdomen showed numerous tiny stones in the mid and distal common bile duct as well as cystic duct.  Largest common bile duct stone measuring 4 mm -Lipase was elevated at 2800, Lipase is down to 93  -Gastroenterology consulted; underwent ERCP with biliary sphincterotomy and complete removal of biliary stones -General Surgery was consulted and patient underwent cholecystectomy - General Surgery has cleared the patient for discharge -Prescribed Percocet as needed for pain       Active Problems:   Renal mass Seen by urology. -Referrals placed for Dr. Sherrilee or Dr.  Francisca. -Urology, Dr. Carolee will follow as outpatient for nephrectomy     Abnormal LFTs In the setting of choledocholithiasis. LFTs are improving Gastroenterology has signed off   Hypokalemia - Replete           Consultants: General Surgery, gastroenterology Procedures performed:   Disposition: Home Diet recommendation:  Discharge Diet Orders (From admission, onward)     Start     Ordered   12/01/23 0000  Diet - low sodium heart healthy        12/01/23 1108           Regular diet DISCHARGE MEDICATION: Allergies as of 12/01/2023       Reactions   Penicillins Nausea And Vomiting, Other (See Comments)   Dizziness, headache, nausea        Medication List     STOP taking these medications    acetaminophen  325 MG tablet Commonly known as: TYLENOL        TAKE these medications    cholecalciferol 25 MCG (1000 UNIT) tablet Commonly known as: VITAMIN D3 Take 1,000 Units by mouth daily.   hyoscyamine  0.125 MG tablet Commonly known as: LEVSIN  Take 0.125 mg by mouth every 8 (eight) hours as needed for cramping or bladder spasms.   multivitamin tablet Take 1 tablet by mouth daily.   oxyCODONE -acetaminophen  5-325 MG tablet Commonly known as: Percocet Take 1 tablet by mouth every 4 (four) hours as needed for severe pain (pain score 7-10).   vitamin B-12 50 MCG tablet Commonly known as: CYANOCOBALAMIN Take 50 mcg by mouth daily.        Follow-up Information     McKenzie, Belvie CROME, MD. Schedule an appointment as soon as possible for a visit .   Specialty: Urology Contact information: [address]  [address] [place] [address] [phone]         Francisca Redell BROCKS, MD. Call .   Specialty: Urology Why: If you are not able to schedule an appointment with Dr. Sherrilee. Contact information: [address] [place] [address] [phone]         [facility]. Call .   Why: If you are unable to schedule  appointments with the urology offices listed above, within a week. Contact information: [address] [place]  [address] [phone]        Tari Tonja Barban, PA-C Follow up on 12/27/2023.   Specialty: General Surgery Why: 3:45pm, Arrive 30 minutes prior to your appointment time, Please bring your insurance card and photo ID Contact information: [address] [facility] [place] [address] [phone]                Discharge Exam: Donna Trevino   11/27/23 1543 11/28/23 1435 11/29/23 1312  Weight: 55.4 kg 55.4 kg 55.4 kg   General-appears in no acute distress Heart-S1-S2, regular, no murmur auscultated Lungs-clear to auscultation bilaterally, no wheezing or crackles auscultated Abdomen-soft, nontender, no organomegaly Extremities-no edema in the lower extremities Neuro-alert, oriented x3, no focal deficit noted  Condition at discharge: good  The results of significant diagnostics from this hospitalization (including imaging, microbiology, ancillary and laboratory) are listed below for reference.   Imaging Studies: DG ERCP Result Date: 11/29/2023 CLINICAL DATA:  [id number] Surgery, elective [phone], pancreatitis, choledocholithiasis EXAM: ERCP TECHNIQUE: Multiple spot images obtained with the fluoroscopic device and submitted for interpretation post-procedure. COMPARISON:  MRCP 11/27/2023 FINDINGS: A series of fluoroscopic spot images document endoscopic cannulation and opacification of the CBD with passage of balloon catheter. IMPRESSION: Endoscopic CBD cannulation and intervention as above. These images were submitted for radiologic interpretation only. Please see the procedural report for the amount of contrast and the fluoroscopy time utilized. Electronically Signed   By: JONETTA Faes M.D.   On: 11/29/2023 09:50   CT CHEST WO CONTRAST Result Date: 11/28/2023 CLINICAL DATA:  Kidney cancer staging.  * Tracking Code: BO *  EXAM: CT CHEST WITHOUT CONTRAST TECHNIQUE: Multidetector CT imaging of the chest was performed following the standard protocol without IV contrast. RADIATION DOSE REDUCTION: This exam was performed according to the departmental dose-optimization program which includes automated exposure control, adjustment of the mA and/or kV according to patient size and/or use of iterative reconstruction technique. COMPARISON:  MR abdomen 11/27/2023 and CT abdomen pelvis 11/27/2023. FINDINGS: Cardiovascular: Heart size normal.  No pericardial effusion. Mediastinum/Nodes: Low left internal jugular lymph nodes measure up to 8 mm (2/8). No pathologically enlarged mediastinal or axillary lymph nodes. Hilar regions are difficult to definitively evaluate without IV contrast. Esophagus is grossly unremarkable. Lungs/Pleura: Minimal subsegmental atelectasis or scarring in the posterior lower lobes. Minimal scarring in the lingula. No suspicious pulmonary nodules. No pleural fluid. Airway is unremarkable. Upper Abdomen: Partially imaged large left renal mass measures at least 7.1 x 8.3 cm. Visualized portions of the liver, adrenal glands, kidneys, spleen, pancreas, stomach and bowel are otherwise grossly unremarkable. No upper abdominal adenopathy. Musculoskeletal: Minimal degenerative change in the spine. No worrisome lytic or sclerotic lesions. IMPRESSION: 1. Partially imaged large left renal mass, compatible with renal cell carcinoma, better imaged on MR and CT abdomen 11/27/2023. 2. Isolated small to borderline enlarged low left internal jugular lymph nodes are indeterminate. Recommend attention on follow-up. 3. Otherwise, no evidence of metastatic disease in  the chest. Electronically Signed   By: Newell Eke M.D.   On: 11/28/2023 10:33   MR ABDOMEN MRCP W WO CONTAST Result Date: 11/27/2023 CLINICAL DATA:  Pancreatitis. Gallstones. Evaluate for choledocholithiasis. EXAM: MRI ABDOMEN WITHOUT AND WITH CONTRAST (INCLUDING MRCP)  TECHNIQUE: Multiplanar multisequence MR imaging of the abdomen was performed both before and after the administration of intravenous contrast. Heavily T2-weighted images of the biliary and pancreatic ducts were obtained, and three-dimensional MRCP images were rendered by post processing. CONTRAST:  5mL GADAVIST  GADOBUTROL  1 MMOL/ML IV SOLN COMPARISON:  Earlier today FINDINGS: Lower chest: No acute findings. Hepatobiliary: No focal no suspicious liver abnormality. Scratch no suspicious liver abnormality. Gallbladder is contracted containing multiple stones. There is no pericholecystic inflammation identified. The common bile duct is dilated measuring up to 1.4 cm. Mild intrahepatic bile duct dilatation. Numerous tiny stones fill the mid and distal common bile duct as well as the cystic duct. The largest common bile duct stone measures 4 mm. The remaining common bile duct stones measure 2-3 mm. Pancreas: No mass, inflammatory changes, or other parenchymal abnormality identified. Spleen:  Within normal limits in size and appearance. Adrenals/Urinary Tract:  Normal adrenal glands. Arising off the anterior upper pole of left kidney is a mixed solid and cystic mass which measures 8.0 x 6.5 by 7.0 cm, image 8/4 and image 14/3. This shows some areas of intrinsic increased T1 signal compatible with hemorrhagic component. The solid components show avid enhancement following the IV administration of contrast material. There is no signs of renal vein invasion. Right kidney appears normal. Stomach/Bowel: Visualized portions within the abdomen are unremarkable. Vascular/Lymphatic: No pathologically enlarged lymph nodes identified. No abdominal aortic aneurysm demonstrated. Other:  No ascites or focal fluid collections. Musculoskeletal: No suspicious bone lesions identified. IMPRESSION: 1. Cholelithiasis without signs of acute cholecystitis. 2. Numerous tiny stones fill the mid and distal common bile duct as well as the cystic  duct. The largest common bile duct stone measures 4 mm. The remaining common bile duct stones measure 2-3 mm. 3. Mixed solid and cystic mass arising off the anterior upper pole of left kidney measures 8.0 x 6.5 x 7.0 cm. This is compatible with renal cell carcinoma. No signs of renal vein invasion or metastatic disease. Electronically Signed   By: Waddell Calk M.D.   On: 11/27/2023 11:56   MR 3D Recon At Scanner Result Date: 11/27/2023 CLINICAL DATA:  Pancreatitis. Gallstones. Evaluate for choledocholithiasis. EXAM: MRI ABDOMEN WITHOUT AND WITH CONTRAST (INCLUDING MRCP) TECHNIQUE: Multiplanar multisequence MR imaging of the abdomen was performed both before and after the administration of intravenous contrast. Heavily T2-weighted images of the biliary and pancreatic ducts were obtained, and three-dimensional MRCP images were rendered by post processing. CONTRAST:  5mL GADAVIST  GADOBUTROL  1 MMOL/ML IV SOLN COMPARISON:  Earlier today FINDINGS: Lower chest: No acute findings. Hepatobiliary: No focal no suspicious liver abnormality. Scratch no suspicious liver abnormality. Gallbladder is contracted containing multiple stones. There is no pericholecystic inflammation identified. The common bile duct is dilated measuring up to 1.4 cm. Mild intrahepatic bile duct dilatation. Numerous tiny stones fill the mid and distal common bile duct as well as the cystic duct. The largest common bile duct stone measures 4 mm. The remaining common bile duct stones measure 2-3 mm. Pancreas: No mass, inflammatory changes, or other parenchymal abnormality identified. Spleen:  Within normal limits in size and appearance. Adrenals/Urinary Tract:  Normal adrenal glands. Arising off the anterior upper pole of left kidney is a mixed solid and  cystic mass which measures 8.0 x 6.5 by 7.0 cm, image 8/4 and image 14/3. This shows some areas of intrinsic increased T1 signal compatible with hemorrhagic component. The solid components show avid  enhancement following the IV administration of contrast material. There is no signs of renal vein invasion. Right kidney appears normal. Stomach/Bowel: Visualized portions within the abdomen are unremarkable. Vascular/Lymphatic: No pathologically enlarged lymph nodes identified. No abdominal aortic aneurysm demonstrated. Other:  No ascites or focal fluid collections. Musculoskeletal: No suspicious bone lesions identified. IMPRESSION: 1. Cholelithiasis without signs of acute cholecystitis. 2. Numerous tiny stones fill the mid and distal common bile duct as well as the cystic duct. The largest common bile duct stone measures 4 mm. The remaining common bile duct stones measure 2-3 mm. 3. Mixed solid and cystic mass arising off the anterior upper pole of left kidney measures 8.0 x 6.5 x 7.0 cm. This is compatible with renal cell carcinoma. No signs of renal vein invasion or metastatic disease. Electronically Signed   By: Waddell Calk M.D.   On: 11/27/2023 11:56   CT ABDOMEN PELVIS W CONTRAST Result Date: 11/27/2023 CLINICAL DATA:  Abdominal pain radiating to the back for 1 week. * Tracking Code: BO * EXAM: CT ABDOMEN AND PELVIS WITH CONTRAST TECHNIQUE: Multidetector CT imaging of the abdomen and pelvis was performed using the standard protocol following bolus administration of intravenous contrast. RADIATION DOSE REDUCTION: This exam was performed according to the departmental dose-optimization program which includes automated exposure control, adjustment of the mA and/or kV according to patient size and/or use of iterative reconstruction technique. CONTRAST:  OMNIPAQUE  IOHEXOL  300 MG/ML  SOLN COMPARISON:  Today's ultrasound of the right upper quadrant. CT of 01/25/2006 is also reviewed. FINDINGS: Lower chest: Clear lung bases. Normal heart size without pericardial or pleural effusion. Hepatobiliary: Normal liver. The gallbladder is contracted with apparent wall thickening including on 32/2. No calcified  stones. Mild intrahepatic biliary duct dilatation. Moderate common duct dilatation, including at 12 mm on coronal reformats. No calcified common duct stone. Pancreas: Normal, without mass or ductal dilatation. Spleen: Normal in size, without focal abnormality. Adrenals/Urinary Tract: Normal adrenal glands.  Normal right kidney. Off the anterior upper pole left kidney is a mixed solid and cystic mass which measures 6.8 x 7.6 by 6.5 cm. Normal urinary bladder. Stomach/Bowel: Normal stomach, without wall thickening. Normal colon, appendix, and terminal ileum. Normal small bowel. Vascular/Lymphatic: Aortic atherosclerosis. Patent renal veins. No abdominopelvic adenopathy. Reproductive: Normal uterus and adnexa.  Prominent gonadal veins. Other: Trace free pelvic fluid is likely physiologic. No free intraperitoneal air. Musculoskeletal: No acute osseous abnormality. IMPRESSION: 1. 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 No renal vein involvement or metastatic disease. 2. Biliary duct dilatation without calcified obstructive stone. Correlate with bilirubin levels and consider MRCP. 3. Contracted gallbladder. Apparent wall thickening could be secondary or represent noncalcified gallstones versus adenomyomatosis. This would also be better evaluated on MRCP. 4. Prominent gonadal veins as can be seen with pelvic congestion syndrome 5.  Aortic Atherosclerosis (ICD10-I70.0).  This is age advanced. Case discussed with Dr. Melvenia at 10:28 a.m. Electronically Signed   By: Rockey Kilts M.D.   On: 11/27/2023 10:29   US  Abdomen Limited Result Date: 11/27/2023 CLINICAL DATA:  Right upper quadrant pain EXAM: ULTRASOUND ABDOMEN LIMITED RIGHT UPPER QUADRANT COMPARISON:  CT 01/25/2006 FINDINGS: Gallbladder: The gallbladder is incompletely distended and poorly evaluated. Moderate wall thickening at 7 mm. Possible shadowing stone. Sonographic Murphy sign could  not be evaluated as patient was  pre-medicated. Common bile duct: Diameter: Moderately dilated at 1.7 cm. Probable common duct stone of 12 mm including on 20/6. Liver: Suspicion of mildly irregular hepatic contour. Portal vein is patent on color Doppler imaging with normal direction of blood flow towards the liver. Other: None. IMPRESSION: Biliary duct dilatation with probable choledocholithiasis. MRCP could confirm. Gallbladder poorly evaluated. Possible cholelithiasis. Nonspecific gallbladder wall thickening. Subtle irregular hepatic capsule for which mild cirrhosis cannot be excluded. This could be of added better evaluated on MRCP. Electronically Signed   By: Rockey Kilts M.D.   On: 11/27/2023 08:44    Microbiology: Results for orders placed or performed during the hospital encounter of 06/15/11  Strep B DNA probe     Status: None   Collection Time: 05/25/11 12:00 AM   Specimen: Vaginal/Rectal  Result Value Ref Range Status   GBS Negative    Strep B DNA probe     Status: None   Collection Time: 05/27/11  9:14 PM   Specimen: Vaginal/Rectal  Result Value Ref Range Status   GBS Negative      Labs: CBC: Recent Labs  Lab 11/27/23 0625 11/28/23 0446 11/29/23 0449 12/01/23 0734  WBC 9.9 7.5 7.1 10.3  NEUTROABS 7.6  --   --  7.9*  HGB 12.9 11.7* 12.2 12.2  HCT 40.5 35.7* 36.7 37.7  MCV 98.8 98.3 97.9 99.0  PLT 280 262 279 287   Basic Metabolic Panel: Recent Labs  Lab 11/27/23 0625 11/28/23 0446 11/29/23 0449 11/30/23 0907 12/01/23 0510  NA 136 135 136 135 136  K 3.7 3.3* 3.7 3.3* 3.7  CL 103 105 103 99 101  CO2 27 23 24 24 26   GLUCOSE 103* 98 97 87 92  BUN 10 <5* 7 11 11   CREATININE 0.67 0.53 0.60 0.34* 0.59  CALCIUM 9.0 8.1* 8.7* 8.8* 8.8*   Liver Function Tests: Recent Labs  Lab 11/27/23 0625 11/28/23 0446 11/29/23 0449 11/30/23 0907 12/01/23 0510  AST 170* 62* 93* 48* 35  ALT 172* 131* 173* 115* 87*  ALKPHOS 167* 131* 173* 138* 125  BILITOT 0.9 1.0 1.1 1.2 0.9  PROT 7.3 6.1* 7.0 6.8 6.8   ALBUMIN 4.0 3.3* 3.6 3.7 3.7   CBG: No results for input(s): GLUCAP in the last 168 hours.  Discharge time spent: greater than 30 minutes.  Signed: Sabas GORMAN Brod, MD [facility] 12/01/2023

## 2023-12-01 NOTE — Progress Notes (Addendum)
 AVS reviewed w/ patient and daughter. Interpretor AMN not used - daughter interpreted per family choice.AVS start date for percocet corrected in pen by this RN on both AVS's to 7/19. No other questions at this time Pt dressing for d/c to home. PIV removed by primary nurse

## 2023-12-01 NOTE — Progress Notes (Signed)
 TOC med in a secure bag delivered to pt in room by this RN

## 2023-12-01 NOTE — Progress Notes (Signed)
    2 Days Post-Op  Subjective:  Somewhat better this morning, has been up walking.  Wants get up and walk again.  Objective: Vital signs in last 24 hours: Temp:  [97.9 F (36.6 C)-98.3 F (36.8 C)] 98 F (36.7 C) (07/19 0609) Pulse Rate:  [73-80] 73 (07/19 0609) Resp:  [15-18] 15 (07/19 0609) BP: (118-140)/(76-94) 118/76 (07/19 0609) SpO2:  [98 %-99 %] 99 % (07/19 0609) Last BM Date : 11/29/23  Intake/Output from previous day: 07/18 0701 - 07/19 0700 In: 720 [P.O.:720] Out: -  Intake/Output this shift: No intake/output data recorded.  PE: Gen: appears to be uncomfortable Abd: soft, tender as expected, but diffusely, greatest in epigastrium, incisions c/d/I, ND  Lab Results:  Recent Labs    11/29/23 0449 12/01/23 0734  WBC 7.1 10.3  HGB 12.2 12.2  HCT 36.7 37.7  PLT 279 287   BMET Recent Labs    11/30/23 0907 12/01/23 0510  NA 135 136  K 3.3* 3.7  CL 99 101  CO2 24 26  GLUCOSE 87 92  BUN 11 11  CREATININE 0.34* 0.59  CALCIUM 8.8* 8.8*   PT/INR No results for input(s): LABPROT, INR in the last 72 hours. CMP     Component Value Date/Time   NA 136 12/01/2023 0510   K 3.7 12/01/2023 0510   CL 101 12/01/2023 0510   CO2 26 12/01/2023 0510   GLUCOSE 92 12/01/2023 0510   BUN 11 12/01/2023 0510   CREATININE 0.59 12/01/2023 0510   CALCIUM 8.8 (L) 12/01/2023 0510   PROT 6.8 12/01/2023 0510   ALBUMIN 3.7 12/01/2023 0510   AST 35 12/01/2023 0510   ALT 87 (H) 12/01/2023 0510   ALKPHOS 125 12/01/2023 0510   BILITOT 0.9 12/01/2023 0510   GFRNONAA >60 12/01/2023 0510   Lipase     Component Value Date/Time   LIPASE 66 (H) 11/29/2023 0449       Studies/Results: No results found.   Anti-infectives: Anti-infectives (From admission, onward)    Start     Dose/Rate Route Frequency Ordered Stop   11/29/23 1400  ceFAZolin  (ANCEF ) IVPB 2g/100 mL premix        2 g 200 mL/hr over 30 Minutes Intravenous  Once 11/29/23 1346 11/29/23 1430         Assessment/Plan POD 2, s/p lap chole by Dr. Lyndel 7/17, for gallstone pancreatitis with CBD stone, s/p ERCP - Patient feeling much better today.  Okay for discharge home.  I wrote a work note to give her 6 weeks off from work.  Contact the office if she wants to adjust this.   FEN - regular VTE - lovenox  ID - no further abx needed  Kidney mass - urology follow up as outpatient   LOS: 3 days    Deward JINNY Lyndel, MD  [facility] 12/01/2023, 9:39 AM Please see Amion for pager number during day hours 7:00am-4:30pm or 7:00am -11:30am on weekends

## 2023-12-01 NOTE — Progress Notes (Signed)
 [facility] Progress Note  SUBJECTIVE:   Interval history: Donna Trevino was seen and evaluated today at bedside.  Daughter at bedside helps with translation.  She continues to have some generalized abdominal pain, right lower quadrant more severe.  Tolerated dinner last night.  No bowel movement.  Urinating.  Past Medical History:  Diagnosis Date   Anxiety    No pertinent past medical history    Past Surgical History:  Procedure Laterality Date   BREAST BIOPSY Right 05/23/2021   CHOLECYSTECTOMY N/A 11/29/2023   Procedure: LAPAROSCOPIC CHOLECYSTECTOMY;  Surgeon: Lyndel Deward PARAS, MD;  Location: [facility];  Service: General;  Laterality: N/A;   ERCP N/A 11/28/2023   Procedure: ERCP, WITH INTERVENTION IF INDICATED;  Surgeon: Rosalie Kitchens, MD;  Location: [facility];  Service: Gastroenterology;  Laterality: N/A;   NO PAST SURGERIES     Current Facility-Administered Medications  Medication Dose Route Frequency Provider Last Rate Last Admin   acetaminophen  (TYLENOL ) tablet 1,000 mg  1,000 mg Oral Q6H Tammy Sor, PA-C   1,000 mg at 12/01/23 9378   enoxaparin  (LOVENOX ) injection 40 mg  40 mg Subcutaneous Q1400 Tammy Sor, PA-C   40 mg at 11/30/23 1247   methocarbamol  (ROBAXIN ) tablet 500 mg  500 mg Oral TID Tammy Sor, PA-C   500 mg at 12/01/23 0948   morphine  (PF) 2 MG/ML injection 2 mg  2 mg Intravenous Q4H PRN Tammy Sor, PA-C   2 mg at 11/30/23 0920   ondansetron  (ZOFRAN ) tablet 4 mg  4 mg Oral Q6H PRN Tammy Sor, PA-C       Or   ondansetron  (ZOFRAN ) injection 4 mg  4 mg Intravenous Q6H PRN Tammy Sor, PA-C   4 mg at 11/29/23 1757   oxyCODONE  (Oxy IR/ROXICODONE ) immediate release tablet 5-10 mg  5-10 mg Oral Q4H PRN Tammy Sor, PA-C   10 mg at 12/01/23 9378   pantoprazole  (PROTONIX ) EC tablet 40 mg  40 mg Oral Daily Lynwood Setter, RPH   40 mg at 12/01/23 9051   Allergies as of 11/27/2023 - Review Complete 11/27/2023  Allergen Reaction  Noted   Penicillins Nausea And Vomiting and Other (See Comments) 11/29/2020   Review of Systems:  Review of Systems  Gastrointestinal:  Positive for abdominal pain. Negative for nausea and vomiting.    OBJECTIVE:   Temp:  [97.9 F (36.6 C)-98.3 F (36.8 C)] 98 F (36.7 C) (07/19 0609) Pulse Rate:  [73-80] 73 (07/19 0609) Resp:  [15-18] 15 (07/19 0609) BP: (118-140)/(76-94) 118/76 (07/19 0609) SpO2:  [98 %-99 %] 99 % (07/19 0609) Last BM Date : 11/29/23 Physical Exam Constitutional:      General: She is not in acute distress.    Appearance: She is not ill-appearing, toxic-appearing or diaphoretic.  Cardiovascular:     Rate and Rhythm: Normal rate and regular rhythm.  Pulmonary:     Effort: No respiratory distress.     Breath sounds: Normal breath sounds.  Abdominal:     General: Bowel sounds are normal. There is no distension.     Palpations: Abdomen is soft.     Tenderness: There is abdominal tenderness. There is no guarding.  Neurological:     Mental Status: She is alert.     Labs: Recent Labs    11/29/23 0449 12/01/23 0734  WBC 7.1 10.3  HGB 12.2 12.2  HCT 36.7 37.7  PLT 279 287   BMET Recent Labs    11/29/23 0449 11/30/23 0907 12/01/23 0510  NA 136  135 136  K 3.7 3.3* 3.7  CL 103 99 101  CO2 24 24 26   GLUCOSE 97 87 92  BUN 7 11 11   CREATININE 0.60 0.34* 0.59  CALCIUM 8.7* 8.8* 8.8*   LFT Recent Labs    12/01/23 0510  PROT 6.8  ALBUMIN 3.7  AST 35  ALT 87*  ALKPHOS 125  BILITOT 0.9   PT/INR No results for input(s): LABPROT, INR in the last 72 hours. Diagnostic imaging: No results found.  IMPRESSION: Cholelithiasis status post ERCP 11/28/2023 Gallstone pancreatitis status post laparoscopic cholecystectomy 11/29/2023 Transaminase elevation secondary to above Abdominal pain secondary to above  PLAN: -Liver enzyme trend reassuring -Tolerating diet -Informed patient that abdominal pain will slowly improve -General Surgery notes  reviewed, planning for discharge today, appropriate from GI perspective -[facility] will sign off and be available as needed   LOS: 3 days   Donna Trevino, [facility]

## 2023-12-03 LAB — SURGICAL PATHOLOGY

## 2023-12-07 ENCOUNTER — Encounter: Payer: Self-pay | Admitting: Urology

## 2023-12-07 ENCOUNTER — Ambulatory Visit (INDEPENDENT_AMBULATORY_CARE_PROVIDER_SITE_OTHER): Payer: Self-pay | Admitting: Urology

## 2023-12-07 VITALS — BP 115/78 | HR 74 | Ht 66.0 in | Wt 120.0 lb

## 2023-12-07 DIAGNOSIS — N2889 Other specified disorders of kidney and ureter: Secondary | ICD-10-CM

## 2023-12-07 NOTE — Progress Notes (Signed)
 I, Maysun LITTIE Griffiths, acting as a scribe for Glendia JAYSON Barba, MD., have documented all relevant documentation on the behalf of Glendia JAYSON Barba, MD, as directed by Glendia JAYSON Barba, MD while in the presence of Glendia JAYSON Barba, MD.  12/07/2023 9:02 PM   Donna Trevino [birth year] [id number]  Chief Complaint  Patient presents with   renal mass    HPI: Tulsi Crossett is a 43 y.o. female presents for evaluation of left renal mass. A Spanish interpreter was present via video link.  Admitted to [facility], 11/27/23 with biliary colic, and CT in the ED showed a large left renal mass. She subsequently underwent MRCP admin which showed 8 x 6.5 x 7 cm upper pole left renal mass with mixed solid and cystic components consistent with renal cell carcinoma. She was referred here secondary to insurance coverage. She underwent a laparoscopic cholecystectomy during her hospitalization and is recovering well.  She has no complaints and denies left-sided flank, abdominal pain, or gross hematuria. No previous history of urologic problems.   PMH: Past Medical History:  Diagnosis Date   Anxiety    No pertinent past medical history     Surgical History: Past Surgical History:  Procedure Laterality Date   BREAST BIOPSY Right 05/23/2021   CHOLECYSTECTOMY N/A 11/29/2023   Procedure: LAPAROSCOPIC CHOLECYSTECTOMY;  Surgeon: Lyndel Deward PARAS, MD;  Location: [facility];  Service: General;  Laterality: N/A;   ERCP N/A 11/28/2023   Procedure: ERCP, WITH INTERVENTION IF INDICATED;  Surgeon: Rosalie Kitchens, MD;  Location: [facility];  Service: Gastroenterology;  Laterality: N/A;   NO PAST SURGERIES      Home Medications:  Allergies as of 12/07/2023       Reactions   Penicillins Nausea And Vomiting, Other (See Comments)   Dizziness, headache, nausea        Medication List        Accurate as of December 07, 2023  9:02 PM. If you have any questions, ask your nurse or doctor.           cholecalciferol 25 MCG (1000 UNIT) tablet Commonly known as: VITAMIN D3 Take 1,000 Units by mouth daily.   hyoscyamine  0.125 MG tablet Commonly known as: LEVSIN  Take 0.125 mg by mouth every 8 (eight) hours as needed for cramping or bladder spasms.   multivitamin tablet Take 1 tablet by mouth daily.   oxyCODONE -acetaminophen  5-325 MG tablet Commonly known as: Percocet Take 1 tablet by mouth every 4 (four) hours as needed for severe pain (pain score 7-10).   vitamin B-12 50 MCG tablet Commonly known as: CYANOCOBALAMIN Take 50 mcg by mouth daily.        Allergies:  Allergies  Allergen Reactions   Penicillins Nausea And Vomiting and Other (See Comments)    Dizziness, headache, nausea    Family History: Family History  Problem Relation Age of Onset   Stomach cancer Father    Breast cancer Maternal Aunt    Breast cancer Maternal Aunt    Anesthesia problems Neg Hx     Social History:  reports that she has never smoked. She has never been exposed to tobacco smoke. She has never used smokeless tobacco. She reports that she does not drink alcohol and does not use drugs.   Physical Exam: BP 115/78   Pulse 74   Ht 5' 6 (1.676 m)   Wt 120 lb (54.4 kg)   LMP 11/17/2023 (Exact Date) Comment: neg. preg test 7/15  BMI 19.37 kg/m  Constitutional:  Alert and oriented, No acute distress. HEENT: [place] AT Respiratory: Normal respiratory effort, no increased work of breathing. Psychiatric: Normal mood and affect.   Pertinent Imaging: MRI images and CT were personally reviewed and interpreted.  MRI  EXAM: MRI ABDOMEN WITHOUT AND WITH CONTRAST (INCLUDING MRCP)  TECHNIQUE: Multiplanar multisequence MR imaging of the abdomen was performed both before and after the administration of intravenous contrast. Heavily T2-weighted images of the biliary and pancreatic ducts were ...   Study Result  Narrative & Impression  CLINICAL DATA:  Pancreatitis. Gallstones. Evaluate  for choledocholithiasis.   EXAM: MRI ABDOMEN WITHOUT AND WITH CONTRAST (INCLUDING MRCP)   TECHNIQUE: Multiplanar multisequence MR imaging of the abdomen was performed both before and after the administration of intravenous contrast. Heavily T2-weighted images of the biliary and pancreatic ducts were obtained, and three-dimensional MRCP images were rendered by post processing.   CONTRAST:  5mL GADAVIST  GADOBUTROL  1 MMOL/ML IV SOLN   COMPARISON:  Earlier today   FINDINGS: Lower chest: No acute findings.   Hepatobiliary: No focal no suspicious liver abnormality. Scratch no suspicious liver abnormality. Gallbladder is contracted containing multiple stones. There is no pericholecystic inflammation identified.   The common bile duct is dilated measuring up to 1.4 cm. Mild intrahepatic bile duct dilatation. Numerous tiny stones fill the mid and distal common bile duct as well as the cystic duct. The largest common bile duct stone measures 4 mm. The remaining common bile duct stones measure 2-3 mm.   Pancreas: No mass, inflammatory changes, or other parenchymal abnormality identified.   Spleen:  Within normal limits in size and appearance.   Adrenals/Urinary Tract:  Normal adrenal glands.   Arising off the anterior upper pole of left kidney is a mixed solid and cystic mass which measures 8.0 x 6.5 by 7.0 cm, image 8/4 and image 14/3. This shows some areas of intrinsic increased T1 signal compatible with hemorrhagic component. The solid components show avid enhancement following the IV administration of contrast material. There is no signs of renal vein invasion.   Right kidney appears normal.   Stomach/Bowel: Visualized portions within the abdomen are unremarkable.   Vascular/Lymphatic: No pathologically enlarged lymph nodes identified. No abdominal aortic aneurysm demonstrated.   Other:  No ascites or focal fluid collections.   Musculoskeletal: No suspicious bone  lesions identified.   IMPRESSION: 1. Cholelithiasis without signs of acute cholecystitis. 2. Numerous tiny stones fill the mid and distal common bile duct as well as the cystic duct. The largest common bile duct stone measures 4 mm. The remaining common bile duct stones measure 2-3 mm. 3. Mixed solid and cystic mass arising off the anterior upper pole of left kidney measures 8.0 x 6.5 x 7.0 cm. This is compatible with renal cell carcinoma. No signs of renal vein invasion or metastatic disease.     Electronically Signed   By: Waddell Calk M.D.   On: 11/27/2023 11:56    CT  EXAM: CT CHEST WITHOUT CONTRAST   TECHNIQUE: Multidetector CT imaging of the chest was performed following the standard protocol without IV contrast.   RADIATION DOSE REDUCTION: This exam was performed according to the departmental dose-optimization program which includes automated exposure control, adjustment of the mA and/or kV according to patient size and/or use of iterative reconstruction technique.   COMPARISON:  MR abdomen 11/27/2023 and CT abdomen pelvis 11/27/2023.   FINDINGS: Cardiovascular: Heart size normal.  No pericardial effusion.   Mediastinum/Nodes: Low left internal jugular lymph nodes measure up  to 8 mm (2/8). No pathologically enlarged mediastinal or axillary lymph nodes. Hilar regions are difficult to definitively evaluate without IV contrast. Esophagus is grossly unremarkable.   Lungs/Pleura: Minimal subsegmental atelectasis or scarring in the posterior lower lobes. Minimal scarring in the lingula. No suspicious pulmonary nodules. No pleural fluid. Airway is unremarkable.   Upper Abdomen: Partially imaged large left renal mass measures at least 7.1 x 8.3 cm. Visualized portions of the liver, adrenal glands, kidneys, spleen, pancreas, stomach and bowel are otherwise grossly unremarkable. No upper abdominal adenopathy.   Musculoskeletal: Minimal degenerative change in the  spine. No worrisome lytic or sclerotic lesions.   IMPRESSION: 1. Partially imaged large left renal mass, compatible with renal cell carcinoma, better imaged on MR and CT abdomen 11/27/2023. 2. Isolated small to borderline enlarged low left internal jugular lymph nodes are indeterminate. Recommend attention on follow-up. 3. Otherwise, no evidence of metastatic disease in the chest.     Electronically Signed   By: Newell Eke M.D.   On: 11/28/2023 10:33   Assessment & Plan:    1. Left renal mass Large left renal mass consistent with renal cell carcinoma. Discussed the best option as a left radical nephrectomy based on the size of the mass. We discussed I am no longer performing major surgery.  I had Dr. Francisca review her CT/MRI and he recommended tertiary referral She is currently living and in [place] and requested referral to [facility]  I have reviewed the above documentation for accuracy and completeness, and I agree with the above.   Glendia JAYSON Barba, MD  [facility] [address] [place], [place] [address] [phone]

## 2023-12-10 ENCOUNTER — Telehealth (INDEPENDENT_AMBULATORY_CARE_PROVIDER_SITE_OTHER): Payer: Self-pay | Admitting: Urology

## 2023-12-10 DIAGNOSIS — N2889 Other specified disorders of kidney and ureter: Secondary | ICD-10-CM

## 2023-12-10 NOTE — Telephone Encounter (Signed)
 Need Spanish interpreter: Please let patient know I had my partner review her MRI and he recommended tertiary center referral.  I have placed a referral to [facility] per her request.  She should be getting a call regarding an appointment there.

## 2023-12-12 ENCOUNTER — Encounter: Payer: Self-pay | Admitting: Urology

## 2023-12-14 NOTE — Telephone Encounter (Signed)
 Pt aware per interpreter.   Pt did make an appt with [facility]. Pt states she wanted to be seen sooner.

## 2023-12-25 ENCOUNTER — Ambulatory Visit: Payer: Self-pay | Admitting: Nurse Practitioner

## 2024-01-07 ENCOUNTER — Ambulatory Visit: Admitting: Urology

## 2024-01-17 ENCOUNTER — Other Ambulatory Visit: Payer: Self-pay | Admitting: Urology

## 2024-01-22 ENCOUNTER — Other Ambulatory Visit: Payer: Self-pay | Admitting: Urology

## 2024-02-29 NOTE — Progress Notes (Signed)
 In person Spanish interpreter and daughter preset at appointment  COVID Vaccine Completed:  Date of COVID positive in last 90 days:  PCP - n/a Cardiologist - Aleene Finely, MD LOV 01/04/21 for CP and syncope- not cardiac related  Chest x-ray - CT 11/28/23 Epic EKG - N/A Stress Test - N/A ECHO - N/A Cardiac Cath - n/a Pacemaker/ICD device last checked:N/A Spinal Cord Stimulator:N/A  Bowel Prep - clears day before, patient aware  Sleep Study - N/A CPAP -   Fasting Blood Sugar - N/A Checks Blood Sugar _____ times a day  Last dose of GLP1 agonist-  N/A GLP1 instructions:  Do not take after     Last dose of SGLT-2 inhibitors-  N/A SGLT-2 instructions:  Do not take after     Blood Thinner Instructions: N/A Last dose:   Time: Aspirin Instructions:N/A Last Dose:  Activity level: Can go up a flight of stairs and perform activities of daily living without stopping and without symptoms of chest pain or shortness of breath.   Anesthesia review: N/A  Patient denies shortness of breath, fever, cough and chest pain at PAT appointment  Patient verbalized understanding of instructions that were given to them at the PAT appointment. Patient was also instructed that they will need to review over the PAT instructions again at home before surgery.

## 2024-02-29 NOTE — Patient Instructions (Addendum)
 SURGICAL WAITING ROOM VISITATION  Patients having surgery or a procedure may have no more than 2 support people in the waiting area - these visitors may rotate.    Children under the age of 51 must have an adult with them who is not the patient.  Visitors with respiratory illnesses are discouraged from visiting and should remain at home.  If the patient needs to stay at the hospital during part of their recovery, the visitor guidelines for inpatient rooms apply. Pre-op nurse will coordinate an appropriate time for 1 support person to accompany patient in pre-op.  This support person may not rotate.    Please refer to the [facility] website for the visitor guidelines for Inpatients (after your surgery is over and you are in a regular room).    Your procedure is scheduled on: 03/12/24   Report to [facility] Main Entrance    Report to admitting at 10:30 AM   Call this number if you have problems the morning of surgery [phone]   Follow a clear liquid diet the day before surgery.  Water Non-Citrus Juices (without pulp, NO RED-Apple, White grape, White cranberry) Black Coffee (NO MILK/CREAM OR CREAMERS, sugar ok)  Clear Tea (NO MILK/CREAM OR CREAMERS, sugar ok) regular and decaf                             Plain Jell-O (NO RED)                                           Fruit ices (not with fruit pulp, NO RED)                                     Popsicles (NO RED)                                                               Sports drinks like Gatorade (NO RED)              Nothing to drink after midnight.           If you have questions, please contact your surgeon's office.   FOLLOW BOWEL PREP AND ANY ADDITIONAL PRE OP INSTRUCTIONS YOU RECEIVED FROM YOUR SURGEON'S OFFICE!!!     Oral Hygiene is also important to reduce your risk of infection.                                    Remember - BRUSH YOUR TEETH THE MORNING OF SURGERY WITH YOUR REGULAR TOOTHPASTE  DENTURES  WILL BE REMOVED PRIOR TO SURGERY PLEASE DO NOT APPLY Poly grip OR ADHESIVES!!!   Stop all vitamins and herbal supplements 7 days before surgery.   Take these medicines the morning of surgery with A SIP OF WATER: None                              You may not  have any metal on your body including hair pins, jewelry, and body piercing             Do not wear make-up, lotions, powders, perfumes, or deodorant  Do not wear nail polish including gel and S&S, artificial/acrylic nails, or any other type of covering on natural nails including finger and toenails. If you have artificial nails, gel coating, etc. that needs to be removed by a nail salon please have this removed prior to surgery or surgery may need to be canceled/ delayed if the surgeon/ anesthesia feels like they are unable to be safely monitored.   Do not shave  48 hours prior to surgery.    Do not bring valuables to the hospital. [place] IS NOT             RESPONSIBLE   FOR VALUABLES.   Contacts, glasses, dentures or bridgework may not be worn into surgery.   Bring small overnight bag day of surgery.   DO NOT BRING YOUR HOME MEDICATIONS TO THE HOSPITAL. PHARMACY WILL DISPENSE MEDICATIONS LISTED ON YOUR MEDICATION LIST TO YOU DURING YOUR ADMISSION IN THE HOSPITAL!   Special Instructions: Bring a copy of your healthcare power of attorney and living will documents the day of surgery if you haven't scanned them before.              Please read over the following fact sheets you were given: IF YOU HAVE QUESTIONS ABOUT YOUR PRE-OP INSTRUCTIONS PLEASE CALL [phone] Millman.   If you received a COVID test during your pre-op visit  it is requested that you wear a mask when out in public, stay away from anyone that may not be feeling well and notify your surgeon if you develop symptoms. If you test positive for Covid or have been in contact with anyone that has tested positive in the last 10 days please notify you surgeon.     [place] - Preparing for Surgery Before surgery, you can play an important role.  Because skin is not sterile, your skin needs to be as free of germs as possible.  You can reduce the number of germs on your skin by washing with CHG (chlorahexidine gluconate) soap before surgery.  CHG is an antiseptic cleaner which kills germs and bonds with the skin to continue killing germs even after washing. Please DO NOT use if you have an allergy to CHG or antibacterial soaps.  If your skin becomes reddened/irritated stop using the CHG and inform your nurse when you arrive at Short Stay. Do not shave (including legs and underarms) for at least 48 hours prior to the first CHG shower.  You may shave your face/neck.  Please follow these instructions carefully:  1.  Shower with CHG Soap the night before surgery ONLY (DO NOT USE THE SOAP THE MORNING OF SURGERY).  2.  If you choose to wash your hair, wash your hair first as usual with your normal  shampoo.  3.  After you shampoo, rinse your hair and body thoroughly to remove the shampoo.                             4.  Use CHG as you would any other liquid soap.  You can apply chg directly to the skin and wash.  Gently with a scrungie or clean washcloth.  5.  Apply the CHG Soap to your body ONLY FROM THE NECK DOWN.  Do   not use on face/ open                           Wound or open sores. Avoid contact with eyes, ears mouth and   genitals (private parts).                       Wash face,  Genitals (private parts) with your normal soap.             6.  Wash thoroughly, paying special attention to the area where your    surgery  will be performed.  7.  Thoroughly rinse your body with warm water from the neck down.  8.  DO NOT shower/wash with your normal soap after using and rinsing off the CHG Soap.                9.  Pat yourself dry with a clean towel.            10.  Wear clean pajamas.            11.  Place clean sheets on your bed the night of your first  shower and do not  sleep with pets. Day of Surgery : Do not apply any CHG, lotions/deodorants the morning of surgery.  Please wear clean clothes to the hospital/surgery center.  FAILURE TO FOLLOW THESE INSTRUCTIONS MAY RESULT IN THE CANCELLATION OF YOUR SURGERY  PATIENT SIGNATURE_________________________________  NURSE SIGNATURE__________________________________  WHAT IS A BLOOD TRANSFUSION? Blood Transfusion Information  A transfusion is the replacement of blood or some of its parts. Blood is made up of multiple cells which provide different functions. Red blood cells carry oxygen and are used for blood loss replacement. White blood cells fight against infection. Platelets control bleeding. Plasma helps clot blood. Other blood products are available for specialized needs, such as hemophilia or other clotting disorders. BEFORE THE TRANSFUSION  Who gives blood for transfusions?  Healthy volunteers who are fully evaluated to make sure their blood is safe. This is blood bank blood. Transfusion therapy is the safest it has ever been in the practice of medicine. Before blood is taken from a donor, a complete history is taken to make sure that person has no history of diseases nor engages in risky social behavior (examples are intravenous drug use or sexual activity with multiple partners). The donor's travel history is screened to minimize risk of transmitting infections, such as malaria. The donated blood is tested for signs of infectious diseases, such as HIV and hepatitis. The blood is then tested to be sure it is compatible with you in order to minimize the chance of a transfusion reaction. If you or a relative donates blood, this is often done in anticipation of surgery and is not appropriate for emergency situations. It takes many days to process the donated blood. RISKS AND COMPLICATIONS Although transfusion therapy is very safe and saves many lives, the main dangers of transfusion include:   Getting an infectious disease. Developing a transfusion reaction. This is an allergic reaction to something in the blood you were given. Every precaution is taken to prevent this. The decision to have a blood transfusion has been considered carefully by your caregiver before blood is given. Blood is not given unless the benefits outweigh the risks. AFTER THE TRANSFUSION Right after receiving a blood transfusion, you will usually feel much better and more energetic. This is especially true if  your red blood cells have gotten low (anemic). The transfusion raises the level of the red blood cells which carry oxygen, and this usually causes an energy increase. The nurse administering the transfusion will monitor you carefully for complications. HOME CARE INSTRUCTIONS  No special instructions are needed after a transfusion. You may find your energy is better. Speak with your caregiver about any limitations on activity for underlying diseases you may have. SEEK MEDICAL CARE IF:  Your condition is not improving after your transfusion. You develop redness or irritation at the intravenous (IV) site. SEEK IMMEDIATE MEDICAL CARE IF:  Any of the following symptoms occur over the next 12 hours: Shaking chills. You have a temperature by mouth above 102 F (38.9 C), not controlled by medicine. Chest, back, or muscle pain. People around you feel you are not acting correctly or are confused. Shortness of breath or difficulty breathing. Dizziness and fainting. You get a rash or develop hives. You have a decrease in urine output. Your urine turns a dark color or changes to pink, red, or brown. Any of the following symptoms occur over the next 10 days: You have a temperature by mouth above 102 F (38.9 C), not controlled by medicine. Shortness of breath. Weakness after normal activity. The white part of the eye turns yellow (jaundice). You have a decrease in the amount of urine or are urinating less  often. Your urine turns a dark color or changes to pink, red, or brown. Document Released: 04/28/2000 Document Revised: 07/24/2011 Document Reviewed: 12/16/2007 [facility] Patient Information 2014 [place], [place].  DEBIDO AL COVID-19 SLO SE PERMITEN DOS VISITANTES (de 16 aos en adelante)  PARA QUE VENGAN CON USTED Y SE QUEDEN EN LA SALA DE ESPERA SOLAMENTE DURANTE EL PRE OP Y EL PROCEDIMIENTO.   **NO SE PERMITEN VISITAS EN EL REA DE CORTA ESTADA NI EN LA SALA DE RECUPERACIN!!**  SI VA A SER INGRESADO(A) AL HOSPITAL SLO SE LE PERMITEN CUATRO PERSONAS DE APOYO DURANTE LAS HORAS DE VISITA (7 AM -8PM)   La(s) persona(s) de apoyo debe(n) pasar nuestra evaluacin, entrar y salir con gel y usar la mscara en todo momento, incluso en la habitacin del paciente. Los pacientes tambin deben usar una mscara cuando el personal o su persona de apoyo estn en la habitacin. Los visitantes DEBEN LLEVAR ETIQUETA DE VISITANTE DE UNA MANERA VISIBLE. Un visitante adulto puede permanecer con usted durante la noche y DEBE estar en la habitacin a las 8 P.M.     Su procedimiento est programado en: 03/12/24   Presntese a la entrada principal del [facility]     Presntese a admisiones por la maana 10:30 AM   Llame a este nmero si tiene problemas la maana de la reubin al [phone]   Siga ignacia dieta de liquidos clasros el dia antes de la peru.  Agua Caf negro (con azcar, SIN [place], NI CREMA)  T normal y descafeinado (con azcar, SIN [place], NI CREMA)                              Gelatina normal (NO ROJA)                                           Helados de frutas (sin pulpa. NO DE COLOR ROJO)  Helados de hielo (NO ROJO)                                                                  Jugo: de layvonne, uva BLANCA, arndano BLANCO Bebidas deportivas como Gatorade (NO ROJAS)  Nada que beber Despus de la medianoche           Si tiene preguntas,  por favor. Pngase en contacto con la oficina de su cirujano.   SIGA LA PREPARACIN INTESTINAL Y CUALQUIER INSTRUCCIN ADICIONAL PREOPERATORIA QUE HAYA RECIBIDO DEL OFICINA DE DU CIRUJANO!!!     La higiene bucal tambin es importante para reducir el riesgo de infeccin.                                   Recuerde - LVESE LOS DIENTES EN LA MAANA DE LA CIRUGA CON SU PASTA DENTAL HABITUAL   Tome estos medicamentos en la maana de la ciruga con UN SORBO DE AGUA: no             No debe trae ningn metal en el cuerpo, incluyendo pinzas para el cabello, joyas, ni aretes/pendientes             No use maquillaje, lociones/cremas, polvos, perfumes/colonias o desodorante  No use esmalte de uas, incluyendo los de gel ni S&S, uas artificiales/acrlicas o cualquier otro tipo de cobertura en las uas naturales, incluyendo las uas de las manos y [employer]. Si tiene uas artificiales, con capas de gel, etc. que necesite que le quiten en un saln de uas, por favor, pida que se lo quiten antes de la ciruga o la ciruga podra ser cancelada/retrasada si el cirujano o el anestesilogo consideran que no puede ser monitoreado(a) de una forma segura.   No se rasure en las 48 horas antes de la operacin.    No traiga objetos de valor al hospital. [place] NO SE HACE RESPONSABLE DE LOS OBJETOS DE VALOR.   Los contactos, las dentaduras o los puentes no se pueden usar durante la [place].   Melonie una bolsa pequea para la noche el da de la [place].   NO TRAIGA AL HOSPITAL LOS MEDICAMENTOS QUE TOMA EN CASA . LA FARMACIA LE SUMINISTRAR LOS MEDICAMENTOS QUE TENGA EN SU LISTA DE MEDICAMENTOS DURANTE SU ESTADA EN EL HOSPITAL!   Instrucciones especiales: Melonie query copia de sus documentos de poder notarial y testamento vital el da de su ciruga si no los ha escaneado antes.              Por favor, lea las siguientes hojas informativas que le dieron: SI TIENE PREGUNTAS SOBRE SUS INSTRUCCIONES  PREOPERATORIAS POR FAVOR [place] [phone] [place]                        PREPARACIN PARA LA CIRUGA                                            Preparing for Surgery  Debido a que la piel no est esterilizada, sta necesita estar lo ms [employer]  de grmenes como sea posible.  Usted puede reducir el nmero de grmenes en la piel lavndose con el jabn de CHG (Chlorahexidine gluconate) antes de la ciruga.  El CHG es un jabn antisptico el cual mata los grmenes y se une a la piel para continuar matando los grmenes incluso hasta despus de lavarse. POR FAVOR NO LO USE SI USTED TIENE ALERGIAS AL CHG.  SI LA PIEL SE IRRITA, DEJE DE USAR EL CHG.  NO SE RASURE DURANTE AL MENOS 12 HORAS ANTES DE LA PRIMERA DUCHA CON EL CHG. Siga estas instrucciones cuidadosamente:  Dchese la noche anterior a la [place] y de nuevo en la maana de la [place]. Si decide lavarse el cabello, lvelo con su champ normal primero. Enjuague el cabello y el cuerpo para quitarse el [place]. Use el CHG como lo hara con cualquier otro jabn lquido, usando una toallita o esponja vegetal o exfoliante. Aplique el CHG al cuerpo solamente DEL CUELLO PARA ABAJO.  No lo use cerca de los ojos o los genitales. No se lave con su jabn normal despus de usar el CHG. Squese con una toalla limpia. Espere hasta la maana siguiente para aplicarse desodorantes, lociones, excepto en el da de la [place], NO SE APLIQUE LOCIONES. Use pijamas limpias o una bata. Coloque sbanas limpias en su cama la noche de su primera ducha - no duerma con mascotas. 10.  Use ropa limpia al venir al hospital.        ________________________________________________________________________ MICHELINA ES UNA TRANSFUSIN DE SANGRE? Informacin sobre la transfusin de sangre  Una transfusin es la sustitucin de sangre o de algunas de sus partes. La sangre est formada por mltiples clulas las cuales desempean diferentes funciones.   Los glbulos rojos transportan  oxgeno y se utilizan para reponer la sangre perdida.   Los glbulos blancos combaten las infecciones.   Las plaquetas controlan el sangrado.   El plasma ayuda a IT sales professional.   Hay otros productos sanguneos disponibles para necesidades especiales, como la hemofilia u otros trastornos de la coagulacin.  ANTES DE LA COAGULACIN  Quin dona sangre para transfusiones?   Voluntarios saludables que son evaluados a fondo para asegurarse de que su sangre es segura. Esta sangre procede de bancos de sangre.  La terapia de transfusin es la ms segura que ha existido en la prctica de Leisure centre manager. Antes de extraer sangre de un donante, se hace un historial completo para asegurarse de que esa persona no tiene antecedentes de enfermedades ni participa en conductas sociales de riesgo (entre los ejemplos est el uso de drogas intravenosas o actividad sexual con mltiples parejas). Se investiga el historial de viajes del donante para minimizar los riesgos de transmisin de infecciones, como la malaria/paludismo. La sangre donada se analiza para Engineer, manufacturing signos de enfermedades infecciosas, como el VIH y la hepatitis. Despus se analiza la sangre para asegurarse de que es compatible con usted, con el fin de minimizar la posibilidad de una reaccin a la transfusin. Si usted o un familiar suyo [employer], con frecuencia lo hacen antes de una ciruga y no es apropiado para situaciones de Associate Professor. El proceso de la sangre donada dura [address]  RIESGOS Y COMPLICACIONES  Aunque la terapia de transfusiones es muy segura y salva muchas vidas, los principales peligros de la transfusin son:   [place] query enfermedad infecciosa.   Desarrollar una reaccin a la transfusin. Esto es una reaccin alrgica a algo en la sangre que se le ha dado. Se toman todas  las precauciones para evitar esto.  La decisin de recibir una transfusin de sangre ha sido considerada cuidadosamente por su cuidador  antes de que se le d Risk manager. La sangre no se da a menos que los beneficios [employer].  DESPUS DE LA TRANSFUSIN   Inmediatamente despus de recibir una transfusin de [place], normalmente se sentir mucho mejor y con ms energa. Esto es especialmente cierto si sus glbulos rojos estn bajos (anmico). La transfusin eleva el nivel de los glbulos rojos que transportan el oxgeno, y esto produce un aumento de Engineer, drilling.   La enfermera que le administre la transfusin le observar cuidadosamente para evitar complicaciones.  INSTRUCCIONES PARA LOS CUIDADOS EN CASA  No se necesitan instrucciones especiales despus de una transfusin. Puede notar que su energa mejora. Hable con su cuidador(a) sobre cualquier limitacin en la actividad en caso de enfermedades no diagnosticadas que pueda tener.  BUSQUE ATENCIN MDICA SI:   Su condicin no mejora despus de la transfusin.   Presenta enrojecimiento o irritacin en el lugar de la va intravenosa (IV).  BUSQUE ATENCIN MDICA INMEDIATA SI:  Se presentan alguno de los sntomas a continuacin en las siguientes 12 horas:   Escalofros.   Tiene una temperatura por va oral superior a 102 F (38.9 C), que no se controla con medicamentos.   Dolor de [place], de espalda o muscular.   Las personas a su alrededor sienten que usted no est actuando correctamente o est confuso(a).   Falta de aire o dificultad para respirar.   Mareos y [employer].   Erupcin cutnea o le salen ronchas.   Disminucin en la produccin de orina.   Su orina se vuelve de color oscuro o cambia  a color rosado, rojo o marrn.  Se presentan alguno de los sntomas a continuacin en los 10 das siguientes:   Tiene una temperatura por va oral superior a 102 F (38.9 C), que no se controla con medicamentos.   Dificultad para respirar.   Debilidad despus de una actividad normal.   La parte blanca del ojo se vuelve amarilla  (ictericia).   Disminucin en la produccin de orina u orina con menos frecuencia.   Su orina se vuelve de color oscuro o cambia  a color rosado, rojo o marrn.  Documento publicado: 04/28/2000 Documento revisado: 07/24/2011 Documento revisado: 12/16/2007  [employer] Patient Information 2014 [employer]. _______________________________________________________________________

## 2024-03-03 ENCOUNTER — Encounter (HOSPITAL_COMMUNITY): Payer: Self-pay

## 2024-03-03 ENCOUNTER — Encounter (HOSPITAL_COMMUNITY)
Admission: RE | Admit: 2024-03-03 | Discharge: 2024-03-03 | Disposition: A | Discharge status: Home / Self Care | Payer: Self-pay | Source: Ambulatory Visit | Attending: Urology | Admitting: Urology

## 2024-03-03 ENCOUNTER — Other Ambulatory Visit: Payer: Self-pay

## 2024-03-03 DIAGNOSIS — Z01812 Encounter for preprocedural laboratory examination: Secondary | ICD-10-CM | POA: Insufficient documentation

## 2024-03-03 LAB — BASIC METABOLIC PANEL WITH GFR
Anion gap: 10 (ref 5–15)
BUN: 9 mg/dL (ref 6–20)
CO2: 25 mmol/L (ref 22–32)
Calcium: 9.5 mg/dL (ref 8.9–10.3)
Chloride: 104 mmol/L (ref 98–111)
Creatinine, Ser: 0.74 mg/dL (ref 0.44–1.00)
GFR, Estimated: >60 mL/min (ref >60)
Glucose, Bld: 91 mg/dL (ref 70–99)
Potassium: 3.9 mmol/L (ref 3.5–5.1)
Sodium: 139 mmol/L (ref 135–145)

## 2024-03-03 LAB — CBC
HCT: 42.2 % (ref 36.0–46.0)
Hemoglobin: 13.5 g/dL (ref 12.0–15.0)
MCH: 31.1 pg (ref 26.0–34.0)
MCHC: 32 g/dL (ref 30.0–36.0)
MCV: 97.2 fL (ref 80.0–100.0)
Platelets: 301 K/uL (ref 150–400)
RBC: 4.34 MIL/uL (ref 3.87–5.11)
RDW: 11.5 % (ref 11.5–15.5)
WBC: 6.8 K/uL (ref 4.0–10.5)
nRBC: 0 % (ref 0.0–0.2)

## 2024-03-12 ENCOUNTER — Inpatient Hospital Stay (HOSPITAL_COMMUNITY)
Admission: RE | Admit: 2024-03-12 | Discharge: 2024-03-14 | DRG: 658 | Disposition: A | Discharge status: Home / Self Care | Source: Ambulatory Visit | Attending: Urology | Admitting: Urology

## 2024-03-12 ENCOUNTER — Inpatient Hospital Stay (HOSPITAL_COMMUNITY): Admitting: Registered Nurse

## 2024-03-12 ENCOUNTER — Encounter (HOSPITAL_COMMUNITY)
Admission: RE | Disposition: A | Discharge status: Home / Self Care | Payer: Self-pay | Source: Ambulatory Visit | Attending: Urology

## 2024-03-12 ENCOUNTER — Encounter (HOSPITAL_COMMUNITY): Payer: Self-pay | Admitting: Urology

## 2024-03-12 ENCOUNTER — Other Ambulatory Visit: Payer: Self-pay

## 2024-03-12 DIAGNOSIS — Z880 Allergy status to penicillin: Secondary | ICD-10-CM

## 2024-03-12 DIAGNOSIS — F419 Anxiety disorder, unspecified: Secondary | ICD-10-CM | POA: Diagnosis present

## 2024-03-12 DIAGNOSIS — Z23 Encounter for immunization: Secondary | ICD-10-CM

## 2024-03-12 DIAGNOSIS — N2889 Other specified disorders of kidney and ureter: Secondary | ICD-10-CM

## 2024-03-12 DIAGNOSIS — C642 Malignant neoplasm of left kidney, except renal pelvis: Principal | ICD-10-CM | POA: Diagnosis present

## 2024-03-12 DIAGNOSIS — R591 Generalized enlarged lymph nodes: Secondary | ICD-10-CM | POA: Diagnosis present

## 2024-03-12 DIAGNOSIS — Z803 Family history of malignant neoplasm of breast: Secondary | ICD-10-CM

## 2024-03-12 DIAGNOSIS — Z800 Family history of malignant neoplasm of digestive organs: Secondary | ICD-10-CM

## 2024-03-12 HISTORY — PX: ROBOT ASSISTED LAPAROSCOPIC NEPHRECTOMY: SHX5140

## 2024-03-12 LAB — TYPE AND SCREEN
ABO/RH(D): O POS
Antibody Screen: NEGATIVE

## 2024-03-12 LAB — HEMOGLOBIN AND HEMATOCRIT, BLOOD
HCT: 39 % (ref 36.0–46.0)
Hemoglobin: 12.3 g/dL (ref 12.0–15.0)

## 2024-03-12 LAB — POCT PREGNANCY, URINE: Preg Test, Ur: NEGATIVE

## 2024-03-12 SURGERY — NEPHRECTOMY, RADICAL, ROBOT-ASSISTED, LAPAROSCOPIC, ADULT
Anesthesia: General | Site: Abdomen | Laterality: Left

## 2024-03-12 MED ORDER — BUPIVACAINE LIPOSOME 1.3 % IJ SUSP
INTRAMUSCULAR | Status: AC
  Filled 2024-03-12: qty 20

## 2024-03-12 MED ORDER — HYDROMORPHONE HCL 1 MG/ML IJ SOLN
INTRAMUSCULAR | Status: DC | PRN
  Administered 2024-03-12: 1 mg via INTRAVENOUS

## 2024-03-12 MED ORDER — MIDAZOLAM HCL 2 MG/2ML IJ SOLN
INTRAMUSCULAR | Status: AC
  Filled 2024-03-12: qty 2

## 2024-03-12 MED ORDER — ORAL CARE MOUTH RINSE: 15.0000 mL | Freq: Once | OROMUCOSAL | Status: AC

## 2024-03-12 MED ORDER — PHENYLEPHRINE 80 MCG/ML (10ML) SYRINGE FOR IV PUSH (FOR BLOOD PRESSURE SUPPORT)
PREFILLED_SYRINGE | INTRAVENOUS | Status: AC
  Filled 2024-03-12: qty 10

## 2024-03-12 MED ORDER — CHLORHEXIDINE GLUCONATE 0.12 % MT SOLN
15.0000 mL | Freq: Once | OROMUCOSAL | Status: AC
  Administered 2024-03-12: 15 mL via OROMUCOSAL

## 2024-03-12 MED ORDER — MEPERIDINE HCL 100 MG/ML IJ SOLN: 6.2500 mg | INTRAMUSCULAR | Status: DC | PRN

## 2024-03-12 MED ORDER — HYOSCYAMINE SULFATE 0.125 MG SL SUBL: 0.1250 mg | SUBLINGUAL_TABLET | SUBLINGUAL | Status: DC | PRN

## 2024-03-12 MED ORDER — HYDROMORPHONE HCL 1 MG/ML IJ SOLN
0.5000 mg | INTRAMUSCULAR | Status: DC | PRN
  Administered 2024-03-13: 1 mg via INTRAVENOUS
  Filled 2024-03-12: qty 1

## 2024-03-12 MED ORDER — HYDROMORPHONE HCL 1 MG/ML IJ SOLN
INTRAMUSCULAR | Status: AC
  Filled 2024-03-12: qty 1

## 2024-03-12 MED ORDER — PHENYLEPHRINE HCL-NACL 20-0.9 MG/250ML-% IV SOLN
INTRAVENOUS | Status: DC | PRN
  Administered 2024-03-12: 50 ug/min via INTRAVENOUS

## 2024-03-12 MED ORDER — LIDOCAINE HCL (CARDIAC) PF 100 MG/5ML IV SOSY
PREFILLED_SYRINGE | INTRAVENOUS | Status: DC | PRN
  Administered 2024-03-12: 40 mg via INTRAVENOUS

## 2024-03-12 MED ORDER — OXYCODONE HCL 5 MG PO TABS
5.0000 mg | ORAL_TABLET | ORAL | Status: DC | PRN
  Administered 2024-03-12 – 2024-03-14 (×4): 5 mg via ORAL
  Filled 2024-03-12 (×4): qty 1

## 2024-03-12 MED ORDER — LACTATED RINGERS IV SOLN: INTRAVENOUS | Status: DC

## 2024-03-12 MED ORDER — ACETAMINOPHEN 500 MG PO TABS
1000.0000 mg | ORAL_TABLET | Freq: Four times a day (QID) | ORAL | Status: AC
  Administered 2024-03-12 – 2024-03-13 (×4): 1000 mg via ORAL
  Filled 2024-03-12 (×4): qty 2

## 2024-03-12 MED ORDER — SODIUM CHLORIDE (PF) 0.9 % IJ SOLN
INTRAMUSCULAR | Status: AC
  Filled 2024-03-12: qty 20

## 2024-03-12 MED ORDER — HYDROMORPHONE HCL 2 MG/ML IJ SOLN
INTRAMUSCULAR | Status: AC
  Filled 2024-03-12: qty 1

## 2024-03-12 MED ORDER — DEXAMETHASONE SOD PHOSPHATE PF 10 MG/ML IJ SOLN
INTRAMUSCULAR | Status: DC | PRN
  Administered 2024-03-12: 10 mg via INTRAVENOUS

## 2024-03-12 MED ORDER — FENTANYL CITRATE (PF) 100 MCG/2ML IJ SOLN
INTRAMUSCULAR | Status: DC | PRN
  Administered 2024-03-12: 50 ug via INTRAVENOUS

## 2024-03-12 MED ORDER — ROCURONIUM BROMIDE 100 MG/10ML IV SOLN
INTRAVENOUS | Status: DC | PRN
  Administered 2024-03-12: 10 mg via INTRAVENOUS
  Administered 2024-03-12: 50 mg via INTRAVENOUS
  Administered 2024-03-12: 20 mg via INTRAVENOUS

## 2024-03-12 MED ORDER — DOCUSATE SODIUM 100 MG PO CAPS
100.0000 mg | ORAL_CAPSULE | Freq: Two times a day (BID) | ORAL | Status: DC
  Administered 2024-03-12 – 2024-03-13 (×2): 100 mg via ORAL
  Filled 2024-03-12 (×3): qty 1

## 2024-03-12 MED ORDER — SODIUM CHLORIDE 0.9 % IV SOLN: INTRAVENOUS | Status: DC

## 2024-03-12 MED ORDER — HYDROMORPHONE HCL 1 MG/ML IJ SOLN
0.2500 mg | INTRAMUSCULAR | Status: DC | PRN
  Administered 2024-03-12 (×2): 0.5 mg via INTRAVENOUS

## 2024-03-12 MED ORDER — MAGNESIUM CITRATE PO SOLN: 1.0000 | Freq: Once | ORAL | Status: DC

## 2024-03-12 MED ORDER — ONDANSETRON HCL 4 MG/2ML IJ SOLN
INTRAMUSCULAR | Status: DC | PRN
  Administered 2024-03-12: 4 mg via INTRAVENOUS

## 2024-03-12 MED ORDER — CEFAZOLIN SODIUM-DEXTROSE 2-4 GM/100ML-% IV SOLN
2.0000 g | Freq: Once | INTRAVENOUS | Status: AC
  Administered 2024-03-12: 2 g via INTRAVENOUS
  Filled 2024-03-12: qty 100

## 2024-03-12 MED ORDER — OXYCODONE HCL 5 MG PO TABS
5.0000 mg | ORAL_TABLET | Freq: Once | ORAL | Status: AC | PRN
  Administered 2024-03-12: 5 mg via ORAL

## 2024-03-12 MED ORDER — SCOPOLAMINE 1 MG/3DAYS TD PT72
1.0000 | MEDICATED_PATCH | TRANSDERMAL | Status: DC
  Administered 2024-03-12: 1 mg via TRANSDERMAL
  Filled 2024-03-12: qty 1

## 2024-03-12 MED ORDER — PHENYLEPHRINE HCL (PRESSORS) 10 MG/ML IV SOLN
INTRAVENOUS | Status: DC | PRN
  Administered 2024-03-12 (×4): 80 ug via INTRAVENOUS

## 2024-03-12 MED ORDER — LIDOCAINE HCL (PF) 2 % IJ SOLN
INTRAMUSCULAR | Status: AC
  Filled 2024-03-12: qty 5

## 2024-03-12 MED ORDER — OXYCODONE HCL 5 MG PO TABS
ORAL_TABLET | ORAL | Status: AC
  Filled 2024-03-12: qty 1

## 2024-03-12 MED ORDER — STERILE WATER FOR IRRIGATION IR SOLN
Status: DC | PRN
  Administered 2024-03-12: 1000 mL

## 2024-03-12 MED ORDER — FENTANYL CITRATE (PF) 100 MCG/2ML IJ SOLN
INTRAMUSCULAR | Status: AC
  Filled 2024-03-12: qty 2

## 2024-03-12 MED ORDER — LACTATED RINGERS IR SOLN
Status: DC | PRN
  Administered 2024-03-12: 1000 mL

## 2024-03-12 MED ORDER — SUGAMMADEX SODIUM 200 MG/2ML IV SOLN
INTRAVENOUS | Status: DC | PRN
  Administered 2024-03-12: 200 mg via INTRAVENOUS

## 2024-03-12 MED ORDER — PROPOFOL 10 MG/ML IV BOLUS
INTRAVENOUS | Status: DC | PRN
  Administered 2024-03-12: 50 ug/kg/min via INTRAVENOUS
  Administered 2024-03-12: 150 mg via INTRAVENOUS

## 2024-03-12 MED ORDER — DIPHENHYDRAMINE HCL 12.5 MG/5ML PO ELIX: 12.5000 mg | ORAL_SOLUTION | Freq: Four times a day (QID) | ORAL | Status: DC | PRN

## 2024-03-12 MED ORDER — HYDROMORPHONE HCL 1 MG/ML IJ SOLN
0.2500 mg | INTRAMUSCULAR | Status: DC | PRN
  Administered 2024-03-12 (×4): 0.5 mg via INTRAVENOUS

## 2024-03-12 MED ORDER — ROCURONIUM BROMIDE 10 MG/ML (PF) SYRINGE
PREFILLED_SYRINGE | INTRAVENOUS | Status: AC
  Filled 2024-03-12: qty 10

## 2024-03-12 MED ORDER — PROPOFOL 1000 MG/100ML IV EMUL
INTRAVENOUS | Status: AC
  Filled 2024-03-12: qty 100

## 2024-03-12 MED ORDER — MIDAZOLAM HCL 5 MG/5ML IJ SOLN
INTRAMUSCULAR | Status: DC | PRN
  Administered 2024-03-12: 2 mg via INTRAVENOUS

## 2024-03-12 MED ORDER — DIPHENHYDRAMINE HCL 50 MG/ML IJ SOLN: 12.5000 mg | Freq: Four times a day (QID) | INTRAMUSCULAR | Status: DC | PRN

## 2024-03-12 MED ORDER — ACETAMINOPHEN 500 MG PO TABS
1000.0000 mg | ORAL_TABLET | Freq: Once | ORAL | Status: AC
  Administered 2024-03-12: 1000 mg via ORAL
  Filled 2024-03-12: qty 2

## 2024-03-12 MED ORDER — KETAMINE HCL 50 MG/5ML IJ SOSY
PREFILLED_SYRINGE | INTRAMUSCULAR | Status: DC | PRN
  Administered 2024-03-12: 25 mg via INTRAVENOUS

## 2024-03-12 MED ORDER — OXYCODONE HCL 5 MG/5ML PO SOLN: 5.0000 mg | Freq: Once | ORAL | Status: AC | PRN

## 2024-03-12 MED ORDER — SUGAMMADEX SODIUM 200 MG/2ML IV SOLN
INTRAVENOUS | Status: AC
  Filled 2024-03-12: qty 2

## 2024-03-12 MED ORDER — MIDAZOLAM HCL (PF) 2 MG/2ML IJ SOLN: 0.5000 mg | Freq: Once | INTRAMUSCULAR | Status: DC | PRN

## 2024-03-12 MED ORDER — SODIUM CHLORIDE (PF) 0.9 % IJ SOLN
INTRAMUSCULAR | Status: DC | PRN
  Administered 2024-03-12: 40 mL

## 2024-03-12 MED ORDER — ONDANSETRON HCL 4 MG/2ML IJ SOLN: 4.0000 mg | INTRAMUSCULAR | Status: DC | PRN

## 2024-03-12 MED ORDER — KETAMINE HCL 50 MG/5ML IJ SOSY
PREFILLED_SYRINGE | INTRAMUSCULAR | Status: AC
  Filled 2024-03-12: qty 5

## 2024-03-12 MED ORDER — ONDANSETRON HCL 4 MG/2ML IJ SOLN
INTRAMUSCULAR | Status: AC
  Filled 2024-03-12: qty 2

## 2024-03-12 MED ORDER — HYDROMORPHONE HCL 1 MG/ML IJ SOLN
INTRAMUSCULAR | Status: AC
  Filled 2024-03-12: qty 2

## 2024-03-12 MED ORDER — DOCUSATE SODIUM 100 MG PO CAPS
100.0000 mg | ORAL_CAPSULE | Freq: Two times a day (BID) | ORAL | Status: AC
Start: 2024-03-12 — End: ?

## 2024-03-12 MED ORDER — HYDROCODONE-ACETAMINOPHEN 5-325 MG PO TABS: 1.0000 | ORAL_TABLET | Freq: Four times a day (QID) | ORAL | 0 refills | Status: AC | PRN

## 2024-03-12 MED ORDER — INFLUENZA VIRUS VACC SPLIT PF (FLUZONE) 0.5 ML IM SUSY
0.5000 mL | PREFILLED_SYRINGE | INTRAMUSCULAR | Status: AC
  Administered 2024-03-13: 0.5 mL via INTRAMUSCULAR
  Filled 2024-03-12 (×2): qty 0.5

## 2024-03-12 SURGICAL SUPPLY — 54 items
BAG COUNTER SPONGE SURGICOUNT (BAG) ×1 IMPLANT
BAG LAPAROSCOPIC 12 15 PORT 16 (BASKET) ×1 IMPLANT
CHLORAPREP W/TINT 26 (MISCELLANEOUS) ×1 IMPLANT
CLIP LIGATING HEM O LOK PURPLE (MISCELLANEOUS) ×1 IMPLANT
CLIP LIGATING HEMO LOK XL GOLD (MISCELLANEOUS) ×1 IMPLANT
CLIP LIGATING HEMO O LOK GREEN (MISCELLANEOUS) ×1 IMPLANT
COVER SURGICAL LIGHT HANDLE (MISCELLANEOUS) ×1 IMPLANT
COVER TIP SHEARS 8 DVNC (MISCELLANEOUS) ×1 IMPLANT
DERMABOND ADVANCED .7 DNX12 (GAUZE/BANDAGES/DRESSINGS) ×2 IMPLANT
DRAIN CHANNEL 15F RND FF 3/16 (WOUND CARE) IMPLANT
DRAPE ARM DVNC X/XI (DISPOSABLE) ×4 IMPLANT
DRAPE COLUMN DVNC XI (DISPOSABLE) ×1 IMPLANT
DRAPE INCISE IOBAN 66X45 STRL (DRAPES) ×1 IMPLANT
DRAPE SHEET LG 3/4 BI-LAMINATE (DRAPES) ×1 IMPLANT
DRIVER NDL LRG 8 DVNC XI (INSTRUMENTS) ×2 IMPLANT
DRIVER NDLE LRG 8 DVNC XI (INSTRUMENTS) ×2 IMPLANT
ELECT PENCIL ROCKER SW 15FT (MISCELLANEOUS) ×1 IMPLANT
ELECT REM PT RETURN 15FT ADLT (MISCELLANEOUS) ×1 IMPLANT
EVACUATOR SILICONE 100CC (DRAIN) IMPLANT
FORCEPS BPLR FENES DVNC XI (FORCEP) ×1 IMPLANT
FORCEPS PROGRASP DVNC XI (FORCEP) ×1 IMPLANT
GLOVE BIO SURGEON STRL SZ 6.5 (GLOVE) ×1 IMPLANT
GLOVE SURG LX STRL 7.5 STRW (GLOVE) ×2 IMPLANT
GOWN STRL REUS W/ TWL XL LVL3 (GOWN DISPOSABLE) ×2 IMPLANT
GOWN STRL SURGICAL XL XLNG (GOWN DISPOSABLE) ×1 IMPLANT
HOLDER FOLEY CATH W/STRAP (MISCELLANEOUS) ×1 IMPLANT
IRRIGATION SUCT STRKRFLW 2 WTP (MISCELLANEOUS) ×1 IMPLANT
KIT BASIN OR (CUSTOM PROCEDURE TRAY) ×1 IMPLANT
KIT TURNOVER KIT A (KITS) ×1 IMPLANT
LOOP VESSEL MAXI BLUE (MISCELLANEOUS) IMPLANT
NDL INSUFFLATION 14GA 120MM (NEEDLE) ×1 IMPLANT
NEEDLE INSUFFLATION 14GA 120MM (NEEDLE) ×1 IMPLANT
PORT ACCESS TROCAR AIRSEAL 12 (TROCAR) ×1 IMPLANT
PROTECTOR NERVE ULNAR (MISCELLANEOUS) ×2 IMPLANT
RELOAD STAPLE 45 2.6 WHT THIN (STAPLE) IMPLANT
SCISSORS LAP 5X45 EPIX DISP (ENDOMECHANICALS) IMPLANT
SCISSORS MNPLR CVD DVNC XI (INSTRUMENTS) ×1 IMPLANT
SEAL UNIV 5-12 XI (MISCELLANEOUS) ×4 IMPLANT
SET TRI-LUMEN FLTR TB AIRSEAL (TUBING) ×1 IMPLANT
SOLUTION ELECTROSURG ANTI STCK (MISCELLANEOUS) ×1 IMPLANT
SPIKE FLUID TRANSFER (MISCELLANEOUS) ×1 IMPLANT
SPONGE T-LAP 4X18 ~~LOC~~+RFID (SPONGE) IMPLANT
STAPLER POWER ECHELON 45 WIDE (STAPLE) IMPLANT
SUT ETHILON 3 0 PS 1 (SUTURE) IMPLANT
SUT MNCRL AB 4-0 PS2 18 (SUTURE) ×2 IMPLANT
SUT PDS AB 1 CT1 27 (SUTURE) ×3 IMPLANT
SUT VIC AB 2-0 SH 27X BRD (SUTURE) ×1 IMPLANT
SUT VICRYL 0 UR6 27IN ABS (SUTURE) IMPLANT
TOWEL OR 17X26 10 PK STRL BLUE (TOWEL DISPOSABLE) ×1 IMPLANT
TRAY FOLEY MTR SLVR 16FR STAT (SET/KITS/TRAYS/PACK) ×1 IMPLANT
TRAY LAPAROSCOPIC (CUSTOM PROCEDURE TRAY) ×1 IMPLANT
TROCAR Z THREAD OPTICAL 12X100 (TROCAR) ×1 IMPLANT
TROCAR Z-THREAD OPTICAL 5X100M (TROCAR) IMPLANT
WATER STERILE IRR 1000ML POUR (IV SOLUTION) ×1 IMPLANT

## 2024-03-12 NOTE — Anesthesia Preprocedure Evaluation (Addendum)
 Anesthesia Evaluation  Patient identified by MRN, date of birth, ID band Patient awake    Reviewed: Allergy & Precautions, NPO status , Patient's Chart, lab work & pertinent test results  History of Anesthesia Complications Negative for: history of anesthetic complications  Airway Mallampati: I  TM Distance: >3 FB Neck ROM: Full    Dental  (+) Dental Advisory Given   Pulmonary neg pulmonary ROS   breath sounds clear to auscultation       Cardiovascular negative cardio ROS  Rhythm:Regular Rate:Normal     Neuro/Psych   Anxiety     negative neurological ROS     GI/Hepatic negative GI ROS, Neg liver ROS,,,  Endo/Other  negative endocrine ROS    Renal/GU negative Renal ROS     Musculoskeletal   Abdominal   Peds  Hematology Hb 13.5, plt 301k   Anesthesia Other Findings   Reproductive/Obstetrics                              Anesthesia Physical Anesthesia Plan  ASA: 1  Anesthesia Plan: General   Post-op Pain Management: Tylenol  PO (pre-op)*   Induction: Intravenous  PONV Risk Score and Plan: 3 and Ondansetron , Dexamethasone  and Scopolamine  patch - Pre-op  Airway Management Planned: Oral ETT  Additional Equipment: None  Intra-op Plan:   Post-operative Plan: Extubation in OR  Informed Consent: I have reviewed the patients History and Physical, chart, labs and discussed the procedure including the risks, benefits and alternatives for the proposed anesthesia with the patient or authorized representative who has indicated his/her understanding and acceptance.     Dental advisory given and Interpreter used for interview  Plan Discussed with: CRNA and Surgeon  Anesthesia Plan Comments:          Anesthesia Quick Evaluation

## 2024-03-12 NOTE — Transfer of Care (Signed)
 Immediate Anesthesia Transfer of Care Note  Patient: Donna Trevino  Procedure(s) Performed: NEPHRECTOMY, RADICAL, ROBOT-ASSISTED, LAPAROSCOPIC, ADULT (Left: Abdomen)  Patient Location: PACU  Anesthesia Type:General  Level of Consciousness: drowsy  Airway & Oxygen Therapy: Patient Spontanous Breathing and Patient connected to face mask oxygen  Post-op Assessment: Report given to RN and Post -op Vital signs reviewed and stable  Post vital signs: Reviewed and stable  Last Vitals:  Vitals Value Taken Time  BP 115/77 03/12/24 14:25  Temp    Pulse 86 03/12/24 14:28  Resp 13 03/12/24 14:28  SpO2 100 % 03/12/24 14:28  Vitals shown include unfiled device data.  Last Pain:  Vitals:   03/12/24 1133  TempSrc: Oral  PainSc: 0-No pain         Complications: No notable events documented.

## 2024-03-12 NOTE — Brief Op Note (Signed)
 03/12/2024  2:19 PM  PATIENT:  Donna Trevino  43 y.o. female  PRE-OPERATIVE DIAGNOSIS:  LEFT RENAL MASS  POST-OPERATIVE DIAGNOSIS:  LEFT RENAL MASS  PROCEDURE:  Procedure(s) with comments: NEPHRECTOMY, RADICAL, ROBOT-ASSISTED, LAPAROSCOPIC, ADULT (Left) - RETROPERITONEAL LYMPH NODE DISSECTION  SURGEON:  Surgeons and Role:    * Manny, Ricardo KATHEE Raddle., MD - Primary  PHYSICIAN ASSISTANT:   ASSISTANTS: Alan Hammonds PA   ANESTHESIA:   local and general  EBL:  50 mL   BLOOD ADMINISTERED:none  DRAINS: foley to gravity   LOCAL MEDICATIONS USED:  MARCAINE      SPECIMEN:  Source of Specimen:  1 - para-aortic lymph nodes; 2 - left radical nephrectomy  DISPOSITION OF SPECIMEN:  PATHOLOGY  COUNTS:  YES  TOURNIQUET:  * No tourniquets in log *  DICTATION: .Other Dictation: Dictation Number [id number]  PLAN OF CARE: Admit to inpatient   PATIENT DISPOSITION:  PACU - hemodynamically stable.   Delay start of Pharmacological VTE agent (>24hrs) due to surgical blood loss or risk of bleeding: yes

## 2024-03-12 NOTE — Anesthesia Postprocedure Evaluation (Signed)
 Anesthesia Post Note  Patient: International Aid/development Worker  Procedure(s) Performed: NEPHRECTOMY, RADICAL, ROBOT-ASSISTED, LAPAROSCOPIC, ADULT (Left: Abdomen)     Patient location during evaluation: PACU Anesthesia Type: General Level of consciousness: awake and alert, oriented and patient cooperative Pain control: pain improving. Vital Signs Assessment: post-procedure vital signs reviewed and stable Respiratory status: spontaneous breathing, nonlabored ventilation and respiratory function stable Cardiovascular status: blood pressure returned to baseline and stable Postop Assessment: no apparent nausea or vomiting Anesthetic complications: no   No notable events documented.  Last Vitals:  Vitals:   03/12/24 1515 03/12/24 1530  BP: 127/80 (!) 133/91  Pulse: 74 73  Resp: 11 12  Temp:    SpO2: 99% 98%    Last Pain:  Vitals:   03/12/24 1538  TempSrc:   PainSc: 8                  Hadiyah Maricle,E. [place] Spees

## 2024-03-12 NOTE — Discharge Instructions (Signed)
 1- Drain Sites - You may have some mild persistent drainage from old drain site for several days, this is normal. This can be covered with cotton gauze for convenience.  2 - Stiches - Your stitches are all dissolvable. You may notice a loose thread at your incisions, these are normal and require no intervention. You may cut them flush to the skin with fingernail clippers if needed for comfort.  3 - Diet - No restrictions  4 - Activity - No heavy lifting / straining (any activities that require valsalva or bearing down) x 4 weeks. Otherwise, no restrictions.  5 - Bathing - You may shower immediately. Do not take a bath or get into swimming pool where incision sites are submersed in water x 4 weeks.   6 - When to Call the Doctor - Call MD for any fever >102, any acute wound problems, or any severe nausea / vomiting. You can call the [facility] [phone]) 24 hours a day 365 days a year. It will roll-over to the answering service and on-call physician after hours.  [facility] for assistance in obtaining primary care doctor  [facility] [phone] [email or web address]  Department of Social Services to apply for insurance [id number]

## 2024-03-12 NOTE — Anesthesia Procedure Notes (Signed)
 Procedure Name: Intubation Date/Time: 03/12/2024 12:33 PM  Performed by: Lanning Cena RAMAN, CRNAPre-anesthesia Checklist: Patient identified, Emergency Drugs available, Suction available and Patient being monitored Patient Re-evaluated:Patient Re-evaluated prior to induction Oxygen Delivery Method: Circle system utilized Preoxygenation: Pre-oxygenation with 100% oxygen Induction Type: IV induction Ventilation: Mask ventilation without difficulty Laryngoscope Size: Mac and 3 Grade View: Grade I Tube size: 7.0 mm Number of attempts: 1 Airway Equipment and Method: Stylet Placement Confirmation: ETT inserted through vocal cords under direct vision, positive ETCO2, CO2 detector and breath sounds checked- equal and bilateral Secured at: 20 cm Tube secured with: Tape Dental Injury: Teeth and Oropharynx as per pre-operative assessment

## 2024-03-12 NOTE — H&P (Signed)
 Donna Trevino is an 43 y.o. female.    Chief Complaint: Pre-OP LEFT Robotic Radical Nephrectomy  HPI:   1 - Large LEFT Renal Mass - 8cm left upper heterogenous mass incidental on CT 11/2023 at age 22 during episode cholecystitis. Mass is solitary. 2 artery (very close together) / 1 vein left renovascular anatomy. Chest CT w/o overt mets. Cr <1.   PMH sig for lap chole. NO CV disease / blood thinners. Spanish speakign, works in multimedia programmer. Daughter Donna Trevino very involved and helps translate.   Today Donna Trevino is seen to proceed with LEFT nephrectomy. No interval fevers. Hgb 13.5, Cr 0.74 most recently.   Past Medical History:  Diagnosis Date   Anxiety    No pertinent past medical history     Past Surgical History:  Procedure Laterality Date   BREAST BIOPSY Right 05/23/2021   CHOLECYSTECTOMY N/A 11/29/2023   Procedure: LAPAROSCOPIC CHOLECYSTECTOMY;  Surgeon: Lyndel Deward PARAS, MD;  Location: [facility];  Service: General;  Laterality: N/A;   ERCP N/A 11/28/2023   Procedure: ERCP, WITH INTERVENTION IF INDICATED;  Surgeon: Rosalie Kitchens, MD;  Location: [facility];  Service: Gastroenterology;  Laterality: N/A;    Family History  Problem Relation Age of Onset   Stomach cancer Father    Breast cancer Maternal Aunt    Breast cancer Maternal Aunt    Anesthesia problems Neg Hx    Social History:  reports that she has never smoked. She has never been exposed to tobacco smoke. She has never used smokeless tobacco. She reports current alcohol use. She reports that she does not use drugs.  Allergies:  Allergies  Allergen Reactions   Penicillins Nausea And Vomiting and Other (See Comments)    Dizziness, headache, nausea    No medications prior to admission.    No results found for this or any previous visit (from the past 48 hours). No results found.  Review of Systems  Constitutional:  Negative for chills and fever.  All other systems reviewed and are  negative.   Last menstrual period 02/08/2024. Physical Exam Vitals reviewed.  HENT:     Head: Normocephalic.  Eyes:     Pupils: Pupils are equal, round, and reactive to light.  Cardiovascular:     Rate and Rhythm: Normal rate.  Pulmonary:     Effort: Pulmonary effort is normal.  Abdominal:     General: Abdomen is flat.  Genitourinary:    Comments: No CVAT at present Musculoskeletal:        General: Normal range of motion.     Cervical back: Normal range of motion.  Skin:    General: Skin is warm.  Neurological:     General: No focal deficit present.     Mental Status: She is alert.  Psychiatric:        Mood and Affect: Mood normal.      Assessment/Plan  Proceed as planned with LEFT radical nephrectomy. Risks, benefits, alternatives, expected peri-op course discussed previously and reiterated today.   Ricardo KATHEE Alvaro Mickey., MD 03/12/2024, 7:01 AM

## 2024-03-13 ENCOUNTER — Encounter (HOSPITAL_COMMUNITY): Payer: Self-pay | Admitting: Urology

## 2024-03-13 LAB — HEMOGLOBIN AND HEMATOCRIT, BLOOD
HCT: 38.5 % (ref 36.0–46.0)
Hemoglobin: 12 g/dL (ref 12.0–15.0)

## 2024-03-13 LAB — BASIC METABOLIC PANEL WITH GFR
Anion gap: 10 (ref 5–15)
BUN: 7 mg/dL (ref 6–20)
CO2: 24 mmol/L (ref 22–32)
Calcium: 8.9 mg/dL (ref 8.9–10.3)
Chloride: 103 mmol/L (ref 98–111)
Creatinine, Ser: 0.97 mg/dL (ref 0.44–1.00)
GFR, Estimated: >60 mL/min (ref >60)
Glucose, Bld: 94 mg/dL (ref 70–99)
Potassium: 4.3 mmol/L (ref 3.5–5.1)
Sodium: 137 mmol/L (ref 135–145)

## 2024-03-13 MED ORDER — KETOROLAC TROMETHAMINE 15 MG/ML IJ SOLN
15.0000 mg | Freq: Three times a day (TID) | INTRAMUSCULAR | Status: DC
Start: 2024-03-13 — End: 2024-03-14
  Administered 2024-03-13 – 2024-03-14 (×3): 15 mg via INTRAVENOUS
  Filled 2024-03-13 (×3): qty 1

## 2024-03-13 MED ORDER — ENSURE PLUS HIGH PROTEIN PO LIQD
237.0000 mL | Freq: Two times a day (BID) | ORAL | Status: DC
  Administered 2024-03-13 – 2024-03-14 (×3): 237 mL via ORAL

## 2024-03-13 MED ORDER — ACETAMINOPHEN 500 MG PO TABS
1000.0000 mg | ORAL_TABLET | Freq: Three times a day (TID) | ORAL | Status: AC
Start: 2024-03-13 — End: 2024-03-13
  Administered 2024-03-13: 1000 mg via ORAL
  Filled 2024-03-13: qty 2

## 2024-03-13 NOTE — TOC Initial Note (Signed)
 Transition of Care [facility]) - Initial/Assessment Note    Patient Details  Name: Donna Trevino MRN: [id number] Date of Birth: 1980/06/20  Transition of Care [facility]) CM/SW Contact:    Sonda Manuella Quill, RN Phone Number: 03/13/2024, 5:19 PM  Clinical Narrative:                 No PCP/insurance listed; assisted by Blane Ames # [phone]; spoke w/ pt and family in room; pt said she lives at home w/ her daughters; she plans to return w/ their assistance at d/c; pt identified POC dtr Donna Trevino [phone]); she will provide transportation; pt verified she does not have insurance or PCP; she denied SDOH risks; pt said she does not have DME, HH services, or home oxygen; she also said she can afford to pay for medications; pt agreed to receive resources for [facility], Social Services, and [employer]; resources placed in d/c instructions; copy of resources given to pt's dtr; pt/dtr verbalize understanding that they will need to make appt w/ agencies of choice; ICM will follow.  Expected Discharge Plan: Home/Self Care Barriers to Discharge: Continued Medical Work up   Patient Goals and CMS Choice Patient states their goals for this hospitalization and ongoing recovery are:: home          Expected Discharge Plan and Services   Discharge Planning Services: CM Consult   Living arrangements for the past 2 months: Single Family Home                 DME Arranged: N/A DME Agency: NA       HH Arranged: NA HH Agency: NA        Prior Living Arrangements/Services Living arrangements for the past 2 months: Single Family Home Lives with:: Adult Children Patient language and need for interpreter reviewed:: Yes Do you feel safe going back to the place where you live?: Yes      Need for Family Participation in Patient Care: Yes (Comment) Care giver support system in place?: Yes (comment) Current home services:  (n/a) Criminal  Activity/Legal Involvement Pertinent to Current Situation/Hospitalization: No - Comment as needed  Activities of Daily Living   ADL Screening (condition at time of admission) Independently performs ADLs?: Yes (appropriate for developmental age) Is the patient deaf or have difficulty hearing?: No Does the patient have difficulty seeing, even when wearing glasses/contacts?: No Does the patient have difficulty concentrating, remembering, or making decisions?: No  Permission Sought/Granted Permission sought to share information with : Case Manager Permission granted to share information with : Yes, Verbal Permission Granted  Share Information with NAME: Case Manager     Permission granted to share info w Relationship: Donna Trevino (dtr) [phone]     Emotional Assessment Appearance:: Appears stated age Attitude/Demeanor/Rapport: Gracious Affect (typically observed): Accepting Orientation: : Oriented to Self, Oriented to Place, Oriented to  Time, Oriented to Situation Alcohol / Substance Use: Not Applicable Psych Involvement: No (comment)  Admission diagnosis:  Malignant tumor of kidney, 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 [place] [address] Phone: [phone] Fax: [phone]     Social Drivers of Health (SDOH) Social History: SDOH Screenings   Food Insecurity: No Food Insecurity (03/13/2024)  Housing: Low Risk   (03/13/2024)  Transportation Needs: No Transportation Needs (03/13/2024)  Utilities: Not At Risk (03/13/2024)  Tobacco Use: Low Risk  (03/12/2024)   SDOH Interventions: Food Insecurity Interventions: Intervention Not Indicated, Inpatient TOC Housing Interventions: Intervention Not Indicated, Inpatient TOC Transportation Interventions: Intervention Not Indicated, Inpatient TOC Utilities Interventions: Intervention Not Indicated, Inpatient TOC   Readmission Risk Interventions     No data to display

## 2024-03-13 NOTE — Progress Notes (Signed)
 1 Day Post-Op   Subjective/Chief Complaint:  1 - Large LEFT Renal Neoplasm - s/p LEFT robotic radical nephrectomy + retroperitoneal lymph node dissection on 03/12/24. Admitted to 4th floor Urology service post-op. POD 1 Hgb 12 , Cr 0.74, and foley removed and diet advanced to regular.   Today Donna Trevino is improving, but very sore. Voided after foley out, ambulating some, but incisional and referred gas / shoulder pain limits mobility. No fevers.    Objective: Vital signs in last 24 hours: Temp:  [97.1 F (36.2 C)-98.3 F (36.8 C)] 98.2 F (36.8 C) (10/30 0551) Pulse Rate:  [72-96] 74 (10/30 0551) Resp:  [11-18] 17 (10/30 0551) BP: (114-149)/(74-99) 116/85 (10/30 0551) SpO2:  [95 %-100 %] 100 % (10/30 0551) Last BM Date : 03/11/24  Intake/Output from previous day: 10/29 0701 - 10/30 0700 In: 1932.8 [I.V.:1832.8; IV Piggyback:100] Out: 2300 [Urine:2250; Blood:50] Intake/Output this shift: No intake/output data recorded.  NAD, very pleasant, daughter Donna Trevino at bedside and helps translate NLB-RA Mild TTP as expected mostly midline extraction site. All surgical sites c/d/I. No foley SCD's in place, no c/c/e   Lab Results:  Recent Labs    03/12/24 1455 03/13/24 0519  HGB 12.3 12.0  HCT 39.0 38.5   BMET Recent Labs    03/13/24 0519  NA 137  K 4.3  CL 103  CO2 24  GLUCOSE 94  BUN 7  CREATININE 0.97  CALCIUM 8.9   PT/INR No results for input(s): LABPROT, INR in the last 72 hours. ABG No results for input(s): PHART, HCO3 in the last 72 hours.  Invalid input(s): PCO2, PO2  Studies/Results: No results found.  Anti-infectives: Anti-infectives (From admission, onward)    Start     Dose/Rate Route Frequency Ordered Stop   03/12/24 1115  ceFAZolin  (ANCEF ) IVPB 2g/100 mL premix        2 g 200 mL/hr over 30 Minutes Intravenous  Once 03/12/24 1105 03/12/24 1307       Assessment/Plan:  Doing well POD 1 s/p major extirpative surgery for kidney  neoplasm. Goals for DC discussed. Pain really limiting factor at this point which is not surprising as large tumor in very petite lady with very developed abdominal musculature. Add low dose ketorolac (as GFR excellent) + scheduled tylenol  in addition to PRN's. Likely DC tomorrow based on current progress.     Donna Trevino. 03/13/2024

## 2024-03-13 NOTE — Op Note (Signed)
 NAMEMERRIDITH, DERSHEM MEDICAL RECORD NO: [id number] ACCOUNT NO: [id number] DATE OF BIRTH: [birth year] FACILITY: [facility] LOCATION: [facility] PHYSICIAN: Ricardo Likens, MD  Operative Report   DATE OF PROCEDURE: 03/12/2024  PREOPERATIVE DIAGNOSIS: Large left renal mass.  POSTOPERATIVE DIAGNOSIS: Large left renal mass with paraaortic adenopathy.  PROCEDURES PERFORMED: 1. Robotic-assisted laparoscopic left radical nephrectomy. 2. Retroperitoneal lymph node dissection.  ESTIMATED BLOOD LOSS:  50 mL.  COMPLICATIONS: None.    ASSISTANT:  Alan Hammonds, PA  SPECIMENS: 1. Left paraaortic lymph nodes. 2. Left radical nephrectomy.  DRAINS:  Foley catheter to straight drain.   FINDINGS: 1.  Two arteries and one vein, left renal vascular anatomy with a very enlarged gonadal vein, likely from tumor flow. 2.  Shoddy paraaortic lymphadenopathy in the area of the renal hilum and just below.  INDICATIONS: The patient is an incredibly pleasant 43 year old woman who was found incidentally on a workup for cholelithiasis to have a large left upper pole renal mass, clinically localized renal cell carcinoma.  Chest imaging is unremarkable. Options  for management including the recommended path of left radical nephrectomy, were discussed in detail with the patient. She presents for this today.  Informed consent was obtained and placed in the medical record.  DESCRIPTION OF PROCEDURE: The patient being identified and verified and the procedure being left radical nephrectomy was confirmed.  Procedure timeout was performed.  Intravenous antibiotics were administered.  General endotracheal anesthesia was  induced.  The patient was placed into a left side up, full flank position, applying 15 degrees of table flexion, superior arm elevator, and axillary roll.  Sequential compression devices, the bottom leg bent, top leg straight.   She was very carefully  padded at all bony prominences given her very petite  habitus. A Foley catheter had already been placed to straight drain. A sterile field was created using chlorhexidine  gluconate on her left flank and abdomen and a high flow, low pressure  pneumoperitoneum was obtained using Veress technique in the left lower quadrant after passing the aspiration and drop test.   An 8-mm robotic camera port was then placed in position approximately three-and-a-half handbreadths superolateral to the umbilicus.  Laparoscopic examination of the peritoneal cavity noted no significant adhesions and no visceral injury.  There was  immediate mass effect noted from the renal mass.  Additional ports were placed as follows:  Left subcostal 8-mm robotic port, left far lateral 8-mm robotic port, approximately three fingerbreadths superomedial to the anterior superior iliac spine, left paramedian inferior robotic port, approximately a  handbreadth superior to the pubic ramus, two 12-mm assistant port sites in the midline, one approximately two fingerbreadths inferior to the plane of the camera port and another approximately two fingerbreadths superior, with the superior one being an  AirSeal type.  The robot was then docked and passed the electronic checks.  Initial attention was directed at the development of the retroperitoneum.  An incision was made lateral to the descending colon from the area of the splenic flexure towards the area of the internal ring.  The colon and its mesentery was carefully swept  medially off the anterior surface of Gerota's fascia.  Again, there was immediate mass effect noted from the large upper pole mass. The lateral splenic attachments were taken down, allowing the spleen and tail of the pancreas to rotate medially away from  the anterior surface of Gerota's fascia. This plane was further developed such that the pancreas was not in apposition to the mass. The lower pole of the  kidney was identified.  The kidney was placed on gentle lateral traction.  Dissection then proceeded  medial to this. The ureter and gonadal vessels were encountered. The gonadal vessels were quite enlarged in a parasitic-type fashion. Very carefully, these were placed on gentle lateral traction. Dissection was carried within this triangle towards the  renal hilum. At the renal hilum, two arteries and one vein renal vascular anatomy was as anticipated with a lower pole accessory artery.  There was some shotty adenopathy in the area as well that was not significantly appreciated on prior axial imaging.  This was somewhat concerning for possible early metastatic spread.  As such, a dedicated paraaortic lymph node dissection was performed, removing fibrofatty tissue laterally and just posterior to the aorta from the area of the renal vein to a position  approximately 6 cm inferior. Lymphostasis was very carefully achieved with cold clips. The lymphatic tissue was set aside and labeled as paraaortic lymph nodes. The lower acessory artery was controlled proximally using a purple vascular clip. The larger  superior vein was controlled proximally using a gold vascular clip, and these were stapled en bloc distal to the clips using a vascular stapler, which showed excellent hemostatic control of the two arteries.   The renal vein was then controlled using a vascular stapler. The plane was then chosen just lateral to the aorta in a plane that kept the adrenal gland with the kidney. The superior attachments were taken down very carefully with cautery scissors. The  gonadal vein was much smaller in caliber having taken the arterial flow away.  This was doubly clipped and ligated as was the ureter.  This immediately freed up the large left radical nephrectomy specimen, which was carefully placed in an EndoCatch bag  for later retrieval.  Sponge and needle counts were correct. Hemostasis was excellent. We achieved the goal of surgery today.  The robot was then undocked.  The specimen was  retrieved by connecting the previous assistant port sites in the midline and then extending for an additional 2 cm, removing the large left kidney with renal mass specimen.  The specimen was set aside for  permanent pathology. The extraction site was closed at the level of the fascia using 3-0 PDS x 6 followed by the reapproximation of the Scarpa using running Vicryl.  Given her very petite habitus, all of her 8-mm port sites were also closed at the level  of the fascia using a single stitch of #0 Vicryl.  All incision sites were then infiltrated with dilute lipolyzed Marcaine  and closed at the level of the skin using subcuticular Monocryl and followed by Dermabond. The procedure was then terminated.  The  patient tolerated the procedure well. No immediate periprocedural complications. The patient was taken to the Postanesthesia Care Unit in stable condition with plan for inpatient admission.  Please note first assistant, Alan Hammonds, was crucial for all portions of the surgery today. She provided invaluable retraction, suctioning, vascular clipping, vascular stapling, robotic instrument exchange, and general first assistance.   MUK D: 03/12/2024 2:26:43 pm T: 03/13/2024 2:14:00 am  JOB: [id number]/ [id number]

## 2024-03-14 NOTE — Discharge Summary (Signed)
 Physician Discharge Summary  Patient ID: Donna Trevino MRN: [id number] DOB/AGE: 1981-03-11 43 y.o.  Admit date: 03/12/2024 Discharge date: 03/14/2024  Admission Diagnoses: LEFT Renal Mass  Discharge Diagnoses:  Principal Problem:   Renal mass   Discharged Condition: good  Hospital Course:   1 - Large LEFT Renal Neoplasm - s/p LEFT robotic radical nephrectomy + retroperitoneal lymph node dissection on 03/12/24. Admitted to 4th floor Urology service post-op. POD 1 Hgb 12 , Cr 0.74, and foley removed and diet advanced to regular. By the late morning of POD 2, the day of discharge, she is ambulatory, pain controlled on PO meds, maintaining PO nutrition, and felt to be adequate for discharge. Path pending at discharge.   Consults: None  Significant Diagnostic Studies: labs: as per above  Treatments: surgery: as per above  Discharge Exam: Blood pressure 112/76, pulse 78, temperature 98.5 F (36.9 C), temperature source Oral, resp. rate 18, height 5' 4 (1.626 m), weight 54 kg, last menstrual period 02/08/2024, SpO2 99%.  NAD, very pleasant, daughter Donna Trevino at bedside and helps translate NLB-RA Mild TTP as expected mostly midline extraction site. All surgical sites c/d/I. No foley SCD's in place, no c/c/e  Disposition: HOME   Allergies as of 03/14/2024       Reactions   Penicillins Nausea And Vomiting, Other (See Comments)   Dizziness, headache, nausea        Medication List     TAKE these medications    docusate sodium  100 MG capsule Commonly known as: COLACE Take 1 capsule (100 mg total) by mouth 2 (two) times daily.   HYDROcodone-acetaminophen  5-325 MG tablet Commonly known as: NORCO/VICODIN Take 1-2 tablets by mouth every 6 (six) hours as needed for moderate pain (pain score 4-6) or severe pain (pain score 7-10).        Follow-up Information     Donna Ricardo KATHEE Raddle., MD Follow up on 03/25/2024.   Specialty: Urology Why: at 1:30 for MD visit  and pathology review Contact information: [address] [place] [address] [phone]                 Signed: Ricardo KATHEE Donna Raddle. 03/14/2024, 10:29 AM

## 2024-03-14 NOTE — TOC Transition Note (Signed)
 [facility] of Care [facility]) - Discharge Note   Patient Details  Name: Donna Trevino MRN: [id number] Date of Birth: Jul 09, 1980  Transition [facility] Care [facility]) CM/SW Contact:  Bascom Service, RN Phone Number: 03/14/2024, 10:54 AM   Clinical Narrative: additional details see prior ICM note. D/c home. No further CM needs.      Final next level of care: Home/Self Care Barriers to Discharge: No Barriers Identified   Patient Goals and CMS Choice Patient states their goals for this hospitalization and ongoing recovery are:: Home          Discharge Placement                       Discharge Plan and Services Additional resources added to the After Visit Summary for     Discharge Planning Services: CM Consult            DME Arranged: N/A DME Agency: NA       HH Arranged: NA HH Agency: NA        Social Drivers of Health (SDOH) Interventions SDOH Screenings   Food Insecurity: No Food Insecurity (03/13/2024)  Housing: Low Risk  (03/13/2024)  Transportation Needs: No Transportation Needs (03/13/2024)  Utilities: Not At Risk (03/13/2024)  Tobacco Use: Low Risk  (03/12/2024)     Readmission Risk Interventions     No data to display

## 2024-03-15 LAB — SURGICAL PATHOLOGY
